# Patient Record
Sex: Female | Born: 1955 | Race: White | Hispanic: No | Marital: Married | Smoking: Never smoker
Health system: Southern US, Community
[De-identification: ages and names within clinical notes are randomized; demographics above are authoritative.]

## PROBLEM LIST (undated history)

## (undated) DIAGNOSIS — R7303 Prediabetes: Secondary | ICD-10-CM

## (undated) DIAGNOSIS — M25551 Pain in right hip: Secondary | ICD-10-CM

## (undated) DIAGNOSIS — C801 Malignant (primary) neoplasm, unspecified: Secondary | ICD-10-CM

## (undated) DIAGNOSIS — K760 Fatty (change of) liver, not elsewhere classified: Secondary | ICD-10-CM

## (undated) DIAGNOSIS — M160 Bilateral primary osteoarthritis of hip: Secondary | ICD-10-CM

## (undated) DIAGNOSIS — E785 Hyperlipidemia, unspecified: Secondary | ICD-10-CM

## (undated) DIAGNOSIS — M719 Bursopathy, unspecified: Secondary | ICD-10-CM

## (undated) DIAGNOSIS — K219 Gastro-esophageal reflux disease without esophagitis: Secondary | ICD-10-CM

## (undated) DIAGNOSIS — R2232 Localized swelling, mass and lump, left upper limb: Secondary | ICD-10-CM

## (undated) HISTORY — DX: Bursopathy, unspecified: M71.9

## (undated) HISTORY — DX: Fatty (change of) liver, not elsewhere classified: K76.0

## (undated) HISTORY — DX: Pain in right hip: M25.551

## (undated) HISTORY — PX: TUBAL LIGATION: SHX77

## (undated) HISTORY — PX: DILATION AND CURETTAGE OF UTERUS: SHX78

## (undated) HISTORY — DX: Prediabetes: R73.03

## (undated) HISTORY — PX: TONSILLECTOMY: SUR1361

## (undated) HISTORY — DX: Hyperlipidemia, unspecified: E78.5

---

## 2003-06-19 ENCOUNTER — Emergency Department (HOSPITAL_COMMUNITY): Admission: EM | Admit: 2003-06-19 | Discharge: 2003-06-20 | Payer: Self-pay | Admitting: Emergency Medicine

## 2010-02-27 ENCOUNTER — Encounter: Payer: Self-pay | Admitting: Family Medicine

## 2010-08-16 NOTE — H&P (Signed)
Courtney Wallace is an 55 y.o. female.   Chief Complaint: blood per rectum HPI: Has had intermittent blood per rectum over the past three months.  Never has had a TCS.   Some pain with bowel movements.    No past medical history on file.  No past surgical history on file.  No family history on file. Social History:  does not have a smoking history on file. She does not have any smokeless tobacco history on file. Her alcohol and drug histories not on file.  Allergies: Allergies not on file  No current facility-administered medications on file as of .   No current outpatient prescriptions on file as of .    No results found for this or any previous visit (from the past 48 hour(s)). No results found.  Review of Systems  Constitutional: Negative.   HENT: Negative.   Eyes: Negative.   Respiratory: Negative.   Cardiovascular: Negative.   Gastrointestinal: Negative.   Genitourinary: Negative.   Musculoskeletal: Negative.   Skin: Negative.   Neurological: Negative.   Endo/Heme/Allergies: Negative.   Psychiatric/Behavioral: Negative.     There were no vitals taken for this visit. Physical Exam  Constitutional: She is oriented to person, place, and time. She appears well-developed and well-nourished.  HENT:  Head: Normocephalic.  Eyes: Pupils are equal, round, and reactive to light.  Neck: Normal range of motion.  Cardiovascular: Normal rate, regular rhythm and normal heart sounds.   Respiratory: Effort normal and breath sounds normal.  GI: Soft. Bowel sounds are normal. She exhibits no distension and no mass. There is no tenderness.  Musculoskeletal: Normal range of motion.  Neurological: She is alert and oriented to person, place, and time.  Skin: Skin is warm and dry.  Psychiatric: She has a normal mood and affect. Her behavior is normal. Judgment and thought content normal.     Assessment/Plan Need for screening TCS, blood per rectum/Scheduled for TCS on  08/30/10.  Fatisha Rabalais A 08/16/2010, 5:27 PM

## 2010-08-29 MED ORDER — SODIUM CHLORIDE 0.45 % IV SOLN
Freq: Once | INTRAVENOUS | Status: AC
  Administered 2010-08-30: 08:00:00 via INTRAVENOUS

## 2010-08-30 ENCOUNTER — Ambulatory Visit (HOSPITAL_COMMUNITY)
Admission: RE | Admit: 2010-08-30 | Discharge: 2010-08-30 | Disposition: A | Discharge status: Home / Self Care | Payer: BC Managed Care – PPO | Source: Ambulatory Visit | Attending: General Surgery | Admitting: General Surgery

## 2010-08-30 ENCOUNTER — Encounter (HOSPITAL_COMMUNITY): Payer: Self-pay | Admitting: *Deleted

## 2010-08-30 ENCOUNTER — Encounter (HOSPITAL_COMMUNITY)
Admission: RE | Disposition: A | Discharge status: Home / Self Care | Payer: Self-pay | Source: Ambulatory Visit | Attending: General Surgery

## 2010-08-30 DIAGNOSIS — Z1211 Encounter for screening for malignant neoplasm of colon: Secondary | ICD-10-CM | POA: Insufficient documentation

## 2010-08-30 HISTORY — PX: COLONOSCOPY: SHX5424

## 2010-08-30 HISTORY — DX: Gastro-esophageal reflux disease without esophagitis: K21.9

## 2010-08-30 SURGERY — COLONOSCOPY
Anesthesia: Moderate Sedation

## 2010-08-30 MED ORDER — STERILE WATER FOR IRRIGATION IR SOLN
Status: DC | PRN
  Administered 2010-08-30: 09:00:00

## 2010-08-30 MED ORDER — MEPERIDINE HCL 25 MG/ML IJ SOLN
INTRAMUSCULAR | Status: DC | PRN
  Administered 2010-08-30: 50 mg via INTRAVENOUS

## 2010-08-30 MED ORDER — MEPERIDINE HCL 100 MG/ML IJ SOLN
INTRAMUSCULAR | Status: AC
  Filled 2010-08-30: qty 2

## 2010-08-30 MED ORDER — MIDAZOLAM HCL 5 MG/5ML IJ SOLN
INTRAMUSCULAR | Status: DC | PRN
  Administered 2010-08-30: 3 mg via INTRAVENOUS
  Administered 2010-08-30: 1 mg via INTRAVENOUS

## 2010-08-30 MED ORDER — MIDAZOLAM HCL 5 MG/5ML IJ SOLN
INTRAMUSCULAR | Status: AC
  Filled 2010-08-30: qty 10

## 2010-09-07 ENCOUNTER — Encounter (HOSPITAL_COMMUNITY): Payer: Self-pay | Admitting: General Surgery

## 2014-07-30 ENCOUNTER — Emergency Department (HOSPITAL_COMMUNITY)
Admission: EM | Admit: 2014-07-30 | Discharge: 2014-07-30 | Disposition: A | Discharge status: Home / Self Care | Payer: BLUE CROSS/BLUE SHIELD | Attending: Emergency Medicine | Admitting: Emergency Medicine

## 2014-07-30 ENCOUNTER — Emergency Department (HOSPITAL_COMMUNITY): Payer: BLUE CROSS/BLUE SHIELD

## 2014-07-30 ENCOUNTER — Encounter (HOSPITAL_COMMUNITY): Payer: Self-pay | Admitting: Emergency Medicine

## 2014-07-30 DIAGNOSIS — Z880 Allergy status to penicillin: Secondary | ICD-10-CM | POA: Insufficient documentation

## 2014-07-30 DIAGNOSIS — Z8719 Personal history of other diseases of the digestive system: Secondary | ICD-10-CM | POA: Insufficient documentation

## 2014-07-30 DIAGNOSIS — M51369 Other intervertebral disc degeneration, lumbar region without mention of lumbar back pain or lower extremity pain: Secondary | ICD-10-CM

## 2014-07-30 DIAGNOSIS — M47896 Other spondylosis, lumbar region: Secondary | ICD-10-CM | POA: Diagnosis not present

## 2014-07-30 DIAGNOSIS — M5136 Other intervertebral disc degeneration, lumbar region: Secondary | ICD-10-CM | POA: Diagnosis not present

## 2014-07-30 DIAGNOSIS — M545 Low back pain: Secondary | ICD-10-CM | POA: Diagnosis present

## 2014-07-30 DIAGNOSIS — M62830 Muscle spasm of back: Secondary | ICD-10-CM | POA: Diagnosis not present

## 2014-07-30 MED ORDER — IBUPROFEN 600 MG PO TABS: 600.0000 mg | ORAL_TABLET | Freq: Four times a day (QID) | ORAL | Status: DC

## 2014-07-30 MED ORDER — HYDROCODONE-ACETAMINOPHEN 5-325 MG PO TABS: 1.0000 | ORAL_TABLET | ORAL | Status: DC | PRN

## 2014-07-30 MED ORDER — DIAZEPAM 5 MG PO TABS: ORAL_TABLET | ORAL | Status: DC

## 2014-07-30 MED ORDER — ONDANSETRON HCL 4 MG PO TABS
4.0000 mg | ORAL_TABLET | Freq: Once | ORAL | Status: AC
  Administered 2014-07-30: 4 mg via ORAL
  Filled 2014-07-30: qty 1

## 2014-07-30 MED ORDER — HYDROMORPHONE HCL 1 MG/ML IJ SOLN
1.0000 mg | Freq: Once | INTRAMUSCULAR | Status: AC
  Administered 2014-07-30: 1 mg via INTRAMUSCULAR
  Filled 2014-07-30: qty 1

## 2014-07-30 MED ORDER — DIAZEPAM 5 MG PO TABS
5.0000 mg | ORAL_TABLET | Freq: Once | ORAL | Status: AC
  Administered 2014-07-30: 5 mg via ORAL
  Filled 2014-07-30: qty 1

## 2014-07-30 NOTE — ED Notes (Signed)
Pt c/o left lower back pain since 0900 this am. Pt states she has been seeing a chiropractor for back pain.

## 2014-07-30 NOTE — ED Provider Notes (Signed)
CSN: [id number]     Arrival date & time 07/30/14  1617 History   First MD Initiated Contact with Patient 07/30/14 1637     Chief Complaint  Patient presents with  . Back Pain     (Consider location/radiation/quality/duration/timing/severity/associated sxs/prior Treatment) HPI Comments: Patient is a 59 year old female who presents to the emergency department with a complaint of lower back pain, left more than right. The patient states she's had problems with her back for some years, she is usually able to do some stretching, or seizure R chiropractic physician and have the pain resolved. She was seen by the chiropractic physician on yesterday. Today she states that she was able to get up and they've herself and dress herself without problem. Approximately 9 AM when she was going to work she developed a severe pain and could not get out of the car. She has been having pain since that time, and unable to find a comfortable position to lie in, also notices that the pain is increasing. She has not had any loss of bowel or bladder function. She states that she feels that she has to shuffle to walk on Sunday that she does not upset her lower back. She has not noticed any weakness in her extremities, but has pain when she puts weight a tick leg on the left side. There's been no previous operations or procedures involving her back other than chiropractic adjustments.  Patient is a 59 y.o. female presenting with back pain. The history is provided by the patient.  Back Pain Location:  Lumbar spine Associated symptoms: no abdominal pain, no chest pain and no dysuria     Past Medical History  Diagnosis Date  . GERD (gastroesophageal reflux disease)    Past Surgical History  Procedure Laterality Date  . Dilation and curettage of uterus    . Tonsillectomy    . Colonoscopy  08/30/2010    Procedure: COLONOSCOPY;  Surgeon: Jamesetta So;  Location: [facility];  Service: Gastroenterology;  Laterality:  N/A;   No family history on file. History  Substance Use Topics  . Smoking status: Never Smoker   . Smokeless tobacco: Not on file  . Alcohol Use: No   OB History    No data available     Review of Systems  Constitutional: Negative for activity change.       All ROS Neg except as noted in HPI  HENT: Negative for nosebleeds.   Eyes: Negative for photophobia and discharge.  Respiratory: Negative for cough, shortness of breath and wheezing.   Cardiovascular: Negative for chest pain and palpitations.  Gastrointestinal: Negative for abdominal pain and blood in stool.  Genitourinary: Negative for dysuria, frequency and hematuria.  Musculoskeletal: Positive for back pain. Negative for arthralgias and neck pain.  Skin: Negative.   Neurological: Negative for dizziness, seizures and speech difficulty.  Psychiatric/Behavioral: Negative for hallucinations and confusion.      Allergies  Penicillins  Home Medications   Prior to Admission medications   Not on File   BP 124/59 mmHg  Pulse 60  Temp(Src) 97.9 F (36.6 C) (Oral)  Resp 16  Ht 5\' 2"  (1.575 m)  Wt 135 lb (61.236 kg)  BMI 24.69 kg/m2  SpO2 97% Physical Exam  Constitutional: She is oriented to person, place, and time. She appears well-developed and well-nourished.  Non-toxic appearance.  HENT:  Head: Normocephalic.  Right Ear: Tympanic membrane and external ear normal.  Left Ear: Tympanic membrane and external ear normal.  Eyes: EOM and lids are normal. Pupils are equal, round, and reactive to light.  Neck: Normal range of motion. Neck supple. Carotid bruit is not present.  Cardiovascular: Normal rate, regular rhythm, normal heart sounds, intact distal pulses and normal pulses.   Pulmonary/Chest: Breath sounds normal. No respiratory distress.  Abdominal: Soft. Bowel sounds are normal. There is no tenderness. There is no guarding.  Musculoskeletal:       Lumbar back: She exhibits decreased range of motion,  tenderness, pain and spasm. She exhibits no deformity.  Lymphadenopathy:       Head (right side): No submandibular adenopathy present.       Head (left side): No submandibular adenopathy present.    She has no cervical adenopathy.  Neurological: She is alert and oriented to person, place, and time. She has normal strength. No cranial nerve deficit or sensory deficit.  Could not adequately test gait due to patient's level of pain. No gross motor or sensory deficits appreciated of the lower extremity. No muscle atrophy appreciated.  No weakness of shoulder shrugs or grip. No sensory deficits of the upper extremities.  Skin: Skin is warm and dry.  Psychiatric: She has a normal mood and affect. Her speech is normal.  Nursing note and vitals reviewed.   ED Course  Procedures (including critical care time) Labs Review Labs Reviewed  URINALYSIS, ROUTINE W REFLEX MICROSCOPIC (NOT AT [facility])    Imaging Review No results found.   EKG Interpretation None      MDM  Vital signs are well within normal limits. Pulse oximetry is 97% on room air. Within normal limits by my interpretation. CT scan of the lumbar spine is negative for any mass or tumor. There is no compression fracture noted within the lumbar area, or the T12 area. There is noted some disc bulging with central canal narrowing at the L4-L5 area there is also some degenerative changes at the L5-S1 area with the right side being worse than the left.  After pain medication the patient some pain is was significantly improved. The gait is slow but steady. There is no evidence of foot drop, or unusual weakness.  I discussed with the patient the findings on examination, as well as the findings on the CT scan. I suggested to the patient that she discuss the changes in her usual pain and duration of pain with Dr. Nadara Mustard, as well as the findings of the CT scan for additional evaluation and management. Prescription for Valium 3 times a day, Norco  every 4 hours as needed, and ibuprofen 600 mg, 4 times daily with food is given to the patient. The patient is in agreement with this discharge plan.    Final diagnoses:  None    **I have reviewed nursing notes, vital signs, and all appropriate lab and imaging results for this patient.Lily Kocher, PA-C 07/30/14 [place], MD 07/31/14 [phone]

## 2014-07-30 NOTE — ED Notes (Signed)
Pt made aware to return if symptoms worsen or if any life threatening symptoms occur.   

## 2014-07-30 NOTE — Discharge Instructions (Signed)
Your CT scan reveals some bulging disc issues at the L4-L5 area with some mild canal narrowing. You also have some arthritis changes at the L5-S1 area, with the right side being worse than the left. Please take the CD from radiology with you to Dr. Lyman Speller office, and discuss your symptoms and these findings with him. Please use a heating pad when sitting. Please use of Valium and ibuprofen daily. Use Norco for more severe pain if needed. Both Valium and Norco may cause drowsiness, please use these medications with caution. Degenerative Disk Disease Degenerative disk disease is a condition caused by the changes that occur in the cushions of the backbone (spinal disks) as you grow older. Spinal disks are soft and compressible disks located between the bones of the spine (vertebrae). They act like shock absorbers. Degenerative disk disease can affect the whole spine. However, the neck and lower back are most commonly affected. Many changes can occur in the spinal disks with aging, such as:  The spinal disks may dry and shrink.  Small tears may occur in the tough, outer covering of the disk (annulus).  The disk space may become smaller due to loss of water.  Abnormal growths in the bone (spurs) may occur. This can put pressure on the nerve roots exiting the spinal canal, causing pain.  The spinal canal may become narrowed. CAUSES  Degenerative disk disease is a condition caused by the changes that occur in the spinal disks with aging. The exact cause is not known, but there is a genetic basis for many patients. Degenerative changes can occur due to loss of fluid in the disk. This makes the disk thinner and reduces the space between the backbones. Small cracks can develop in the outer layer of the disk. This can lead to the breakdown of the disk. You are more likely to get degenerative disk disease if you are overweight. Smoking cigarettes and doing heavy work such as weightlifting can also increase your  risk of this condition. Degenerative changes can start after a sudden injury. Growth of bone spurs can compress the nerve roots and cause pain.  SYMPTOMS  The symptoms vary from person to person. Some people may have no pain, while others have severe pain. The pain may be so severe that it can limit your activities. The location of the pain depends on the part of your backbone that is affected. You will have neck or arm pain if a disk in the neck area is affected. You will have pain in your back, buttocks, or legs if a disk in the lower back is affected. The pain becomes worse while bending, reaching up, or with twisting movements. The pain may start gradually and then get worse as time passes. It may also start after a major or minor injury. You may feel numbness or tingling in the arms or legs.  DIAGNOSIS  Your caregiver will ask you about your symptoms and about activities or habits that may cause the pain. He or she may also ask about any injuries, diseases, or treatments you have had earlier. Your caregiver will examine you to check for the range of movement that is possible in the affected area, to check for strength in your extremities, and to check for sensation in the areas of the arms and legs supplied by different nerve roots. An X-ray of the spine may be taken. Your caregiver may suggest other imaging tests, such as magnetic resonance imaging (MRI), if needed.  TREATMENT  Treatment includes  rest, modifying your activities, and applying ice and heat. Your caregiver may prescribe medicines to reduce your pain and may ask you to do some exercises to strengthen your back. In some cases, you may need surgery. You and your caregiver will decide on the treatment that is best for you. HOME CARE INSTRUCTIONS   Follow proper lifting and walking techniques as advised by your caregiver.  Maintain good posture.  Exercise regularly as advised.  Perform relaxation exercises.  Change your sitting,  standing, and sleeping habits as advised. Change positions frequently.  Lose weight as advised.  Stop smoking if you smoke.  Wear supportive footwear. SEEK MEDICAL CARE IF:  Your pain does not go away within 1 to 4 weeks. SEEK IMMEDIATE MEDICAL CARE IF:   Your pain is severe.  You notice weakness in your arms, hands, or legs.  You begin to lose control of your bladder or bowel movements. MAKE SURE YOU:   Understand these instructions.  Will watch your condition.  Will get help right away if you are not doing well or get worse. Document Released: 11/20/2006 Document Revised: 04/17/2011 Document Reviewed: 05/27/2013 [facility] Patient Information 2015 [place], [place]. This information is not intended to replace advice given to you by your health care provider. Make sure you discuss any questions you have with your health care provider.

## 2015-01-05 ENCOUNTER — Ambulatory Visit (INDEPENDENT_AMBULATORY_CARE_PROVIDER_SITE_OTHER): Payer: BLUE CROSS/BLUE SHIELD | Admitting: Podiatry

## 2015-01-05 ENCOUNTER — Ambulatory Visit (INDEPENDENT_AMBULATORY_CARE_PROVIDER_SITE_OTHER): Payer: BLUE CROSS/BLUE SHIELD

## 2015-01-05 ENCOUNTER — Encounter: Payer: Self-pay | Admitting: Podiatry

## 2015-01-05 VITALS — BP 119/69 | HR 74 | Resp 16 | Ht 62.0 in | Wt 140.0 lb

## 2015-01-05 DIAGNOSIS — D3610 Benign neoplasm of peripheral nerves and autonomic nervous system, unspecified: Secondary | ICD-10-CM | POA: Diagnosis not present

## 2015-01-05 DIAGNOSIS — M2142 Flat foot [pes planus] (acquired), left foot: Secondary | ICD-10-CM

## 2015-01-05 DIAGNOSIS — M2141 Flat foot [pes planus] (acquired), right foot: Secondary | ICD-10-CM

## 2015-01-05 NOTE — Progress Notes (Signed)
   Subjective:    Patient ID: Courtney Wallace, female    DOB: 1955/02/17, 59 y.o.   MRN: [id number]  HPI: She presents today with a 12 year duration of painful toes bilateral foot. She states that I have intermittent pain throughout my toes which sometimes is numb and tingling and other times feels like electrical shocks. She states that is very sporadic. She's tried different over-the-counter insoles but after talking to her husband about his orthotics she would like to consider orthotics.    Review of Systems  HENT: Positive for sinus pressure.   All other systems reviewed and are negative.      Objective:   Physical Exam: She is 59 year old female in no apparent distress vital signs stable alert and oriented 3. Pulses are strongly palpable. Neurologic sensorium is intact per Semmes-Weinstein monofilament. Deep tendon reflexes are intact bilateral and muscle strength +5 over 5 dorsiflexion plantar flexors and inverters everters onto the musculature is intact. Orthopedic evaluation demonstrates flexible pes planus bilateral. No reproducible pain on palpation other than a mild Mulder's click to the third interdigital space bilaterally which is quite uncomfortable for her. Radiographs taken 3 views of the office today demonstrates pes planus with no signs of arthritis. No coalitions. Cutaneous evaluation demonstrates supple well-hydrated cutis no erythema edema saline as drainage or odor. No lytic wounds lesions no odor.        Assessment & Plan:  Assessment: Pes planus with neuroma bilateral foot.  Plan: We discussed appropriate shoe gear stretching exercises ice therapy issue or modifications. We discussed the etiology pathology insert of her surgical therapies. At this point we have decided not to perform any injections to the neuromas but to try a set of orthotics. I will follow up with her once those come in. She was scanned today prior to her leaving.  Roselind Messier DPM

## 2015-01-26 ENCOUNTER — Ambulatory Visit: Payer: BLUE CROSS/BLUE SHIELD | Admitting: *Deleted

## 2015-01-26 DIAGNOSIS — M2141 Flat foot [pes planus] (acquired), right foot: Secondary | ICD-10-CM

## 2015-01-26 DIAGNOSIS — M2142 Flat foot [pes planus] (acquired), left foot: Principal | ICD-10-CM

## 2015-01-26 NOTE — Progress Notes (Signed)
Patient ID: Courtney Wallace, female   DOB: 1955-07-01, 59 y.o.   MRN: [id number] Patient presents for orthotic pick up.  Verbal and written break in and wear instructions given.  Patient will follow up in 4 weeks if symptoms worsen or fail to improve.

## 2015-01-26 NOTE — Patient Instructions (Signed)

## 2015-05-25 DIAGNOSIS — Z23 Encounter for immunization: Secondary | ICD-10-CM | POA: Diagnosis not present

## 2015-05-25 DIAGNOSIS — S81811A Laceration without foreign body, right lower leg, initial encounter: Secondary | ICD-10-CM | POA: Diagnosis not present

## 2015-05-25 DIAGNOSIS — Z6826 Body mass index (BMI) 26.0-26.9, adult: Secondary | ICD-10-CM | POA: Diagnosis not present

## 2015-05-25 DIAGNOSIS — L03115 Cellulitis of right lower limb: Secondary | ICD-10-CM | POA: Diagnosis not present

## 2015-11-22 DIAGNOSIS — Z23 Encounter for immunization: Secondary | ICD-10-CM | POA: Diagnosis not present

## 2015-11-25 DIAGNOSIS — H52223 Regular astigmatism, bilateral: Secondary | ICD-10-CM | POA: Diagnosis not present

## 2015-11-25 DIAGNOSIS — H5203 Hypermetropia, bilateral: Secondary | ICD-10-CM | POA: Diagnosis not present

## 2016-03-16 DIAGNOSIS — L738 Other specified follicular disorders: Secondary | ICD-10-CM | POA: Diagnosis not present

## 2016-03-16 DIAGNOSIS — L821 Other seborrheic keratosis: Secondary | ICD-10-CM | POA: Diagnosis not present

## 2016-03-16 DIAGNOSIS — L818 Other specified disorders of pigmentation: Secondary | ICD-10-CM | POA: Diagnosis not present

## 2016-03-16 DIAGNOSIS — L719 Rosacea, unspecified: Secondary | ICD-10-CM | POA: Diagnosis not present

## 2016-03-21 DIAGNOSIS — F4325 Adjustment disorder with mixed disturbance of emotions and conduct: Secondary | ICD-10-CM | POA: Diagnosis not present

## 2016-04-04 DIAGNOSIS — Z6827 Body mass index (BMI) 27.0-27.9, adult: Secondary | ICD-10-CM | POA: Diagnosis not present

## 2016-04-04 DIAGNOSIS — J111 Influenza due to unidentified influenza virus with other respiratory manifestations: Secondary | ICD-10-CM | POA: Diagnosis not present

## 2016-04-10 ENCOUNTER — Encounter (HOSPITAL_COMMUNITY): Payer: Self-pay | Admitting: Emergency Medicine

## 2016-04-10 ENCOUNTER — Emergency Department (HOSPITAL_COMMUNITY)
Admission: EM | Admit: 2016-04-10 | Discharge: 2016-04-10 | Disposition: A | Discharge status: Home / Self Care | Payer: BLUE CROSS/BLUE SHIELD | Attending: Emergency Medicine | Admitting: Emergency Medicine

## 2016-04-10 ENCOUNTER — Emergency Department (HOSPITAL_COMMUNITY): Payer: BLUE CROSS/BLUE SHIELD

## 2016-04-10 DIAGNOSIS — R51 Headache: Secondary | ICD-10-CM | POA: Diagnosis not present

## 2016-04-10 DIAGNOSIS — F4325 Adjustment disorder with mixed disturbance of emotions and conduct: Secondary | ICD-10-CM | POA: Diagnosis not present

## 2016-04-10 DIAGNOSIS — G44209 Tension-type headache, unspecified, not intractable: Secondary | ICD-10-CM | POA: Diagnosis not present

## 2016-04-10 DIAGNOSIS — G4489 Other headache syndrome: Secondary | ICD-10-CM | POA: Diagnosis not present

## 2016-04-10 DIAGNOSIS — R030 Elevated blood-pressure reading, without diagnosis of hypertension: Secondary | ICD-10-CM | POA: Diagnosis not present

## 2016-04-10 LAB — CBG MONITORING, ED: GLUCOSE-CAPILLARY: 114 mg/dL — AB (ref 65–99)

## 2016-04-10 MED ORDER — METOCLOPRAMIDE HCL 5 MG/ML IJ SOLN
10.0000 mg | Freq: Once | INTRAMUSCULAR | Status: AC
  Administered 2016-04-10: 10 mg via INTRAVENOUS
  Filled 2016-04-10: qty 2

## 2016-04-10 MED ORDER — METOCLOPRAMIDE HCL 10 MG PO TABS: 10.0000 mg | ORAL_TABLET | Freq: Every day | ORAL | 0 refills | Status: DC | PRN

## 2016-04-10 MED ORDER — DEXAMETHASONE SODIUM PHOSPHATE 10 MG/ML IJ SOLN
10.0000 mg | Freq: Once | INTRAMUSCULAR | Status: AC
  Administered 2016-04-10: 10 mg via INTRAVENOUS
  Filled 2016-04-10: qty 1

## 2016-04-10 MED ORDER — DIPHENHYDRAMINE HCL 50 MG/ML IJ SOLN
25.0000 mg | Freq: Once | INTRAMUSCULAR | Status: AC
  Administered 2016-04-10: 25 mg via INTRAVENOUS
  Filled 2016-04-10: qty 1

## 2016-04-10 MED ORDER — SODIUM CHLORIDE 0.9 % IV BOLUS (SEPSIS)
1000.0000 mL | Freq: Once | INTRAVENOUS | Status: AC
  Administered 2016-04-10: 1000 mL via INTRAVENOUS

## 2016-04-10 NOTE — Discharge Instructions (Signed)
One of the medications  you received is an antiinflammatory and will continue to work on getting  your headaches controlled  Drink plenty of water Keep stress down Rest  If continue to have frequent headaches, talk with your PCP about seeing a neurologist  Return for passing out, severe headache, focal weakness or numbness, lethargy, confusion

## 2016-04-10 NOTE — ED Notes (Signed)
ED Provider at bedside. 

## 2016-04-10 NOTE — ED Triage Notes (Signed)
Pt complaining of severe migraine headache. 4 over the last 48 hours. Pt had the flu last week. All migraines have lasted approx 3 hours. No N/V. No vision changes. BP 144/83, HR 72, resp 18. EMs reports no neuro deficits. Pain 10/10

## 2016-04-10 NOTE — ED Provider Notes (Signed)
Complains of 4 sudden onset headaches over the past 2 days. Headaches started at occiput and radiated around to the frontal area. There are throbbing in nature the first 3 headaches were onset after coughing episodes today's headache was onset while having a discussion in a marriage counselor's office. Other associated symptoms include dry mouth for "years" Pain is much improved since treatment here on exam she is alert and in no distress Glasgow Coma Score 15 neurologic exam cranial nerves II through XII grossly intact 8 normal Romberg normal pronator drift normal finger to nose normal. She feels improved and ready to go home presently. I don't feel that patient needs lumbar puncture in light of negative CT scan with today's headache starting less than 6 hours ago.   Orlie Dakin, MD 04/10/16 2000

## 2016-04-10 NOTE — ED Provider Notes (Signed)
[facility] DEPT Provider Note   CSN: [id number] Arrival date & time: 04/10/16  1741     History   Chief Complaint Chief Complaint  Patient presents with  . Headache    HPI VARIE Courtney Wallace is a 61 y.o. female.  HPI Pt complaining of severe migraine headache. 4 over the last 48 hours. Pt had the flu last week. All migraines have lasted approx 3 hours. No N/V. No vision changes. Pain 10/10  Patient states her symptoms are severe and sudden in onset. She states that her headache becomes a 12 out of 10Within seconds of onset of the headache. She frequently has a shooting pain across bilateral arms with it and did have based on the prior 3 episodes but did not today. She had been symptom free for the last 2 days but then today had a severe onset of acute headache without the shooting pains across her posterior head and then bilaterally on the front for head. She denies any history of migraines or headaches. She does state she's been sick recently with the flu but no fevers. She denies any neck stiffness but did have some neck stiffness 4 days ago. Denies any falls, no injury to the head. She is not on any anticoagulation. She has no history of any aneurysm since herself or family. She is otherwise healthy, denies any drug use. Denies any weakness, numbness. States her symptoms are severe. She went to her primary care prior and was given Tylenol and ibuprofen without significant improvement in her headache. She states light makes her symptoms worse.  Past Medical History:  Diagnosis Date  . GERD (gastroesophageal reflux disease)     There are no active problems to display for this patient.   Past Surgical History:  Procedure Laterality Date  . COLONOSCOPY  08/30/2010   Procedure: COLONOSCOPY;  Surgeon: Jamesetta So;  Location: [facility];  Service: Gastroenterology;  Laterality: N/A;  . DILATION AND CURETTAGE OF UTERUS    . TONSILLECTOMY      OB History    No data available        Home Medications    Prior to Admission medications   Medication Sig Start Date End Date Taking? Authorizing Provider  metoCLOPramide (REGLAN) 10 MG tablet Take 1 tablet (10 mg total) by mouth daily as needed (headache). 04/10/16   Karma Greaser, MD    Family History History reviewed. No pertinent family history.  Social History Social History  Substance Use Topics  . Smoking status: Never Smoker  . Smokeless tobacco: Never Used  . Alcohol use No     Allergies   Penicillins   Review of Systems Review of Systems  Constitutional: Negative for fever.  Allergic/Immunologic: Negative for immunocompromised state.  All other systems reviewed and are negative.    Physical Exam Updated Vital Signs BP 129/71 (BP Location: Left Arm)   Pulse 72   Temp 98 F (36.7 C) (Oral)   Resp 12   Ht 5\' 2"  (1.575 m)   Wt 67.1 kg   SpO2 100%   BMI 27.07 kg/m   Physical Exam  Constitutional: She is oriented to person, place, and time. She appears well-developed and well-nourished. She appears distressed.  Patient appears uncomfortable with her head covered by towel  HENT:  Head: Normocephalic and atraumatic.  Eyes: Conjunctivae are normal.  Neck: Neck supple.  Cardiovascular: Normal rate and regular rhythm.   No murmur heard. Pulmonary/Chest: Effort normal and breath sounds normal. No respiratory  distress.  Abdominal: Soft. There is no tenderness.  Musculoskeletal: She exhibits no edema.  Neurological: She is alert and oriented to person, place, and time. She has normal strength. She displays no atrophy, no tremor and normal reflexes. No cranial nerve deficit or sensory deficit. She exhibits normal muscle tone. She displays a negative Romberg sign. She displays no seizure activity. Coordination and gait normal.  Skin: Skin is warm and dry.  Psychiatric: She has a normal mood and affect.  Nursing note and vitals reviewed.    ED Treatments / Results  Labs (all labs  ordered are listed, but only abnormal results are displayed) Labs Reviewed  CBG MONITORING, ED - Abnormal; Notable for the following:       Result Value   Glucose-Capillary 114 (*)    All other components within normal limits    EKG  EKG Interpretation None       Radiology Ct Head Wo Contrast  Result Date: 04/10/2016 CLINICAL DATA:  Persistent headache, flu last week EXAM: CT HEAD WITHOUT CONTRAST TECHNIQUE: Contiguous axial images were obtained from the base of the skull through the vertex without intravenous contrast. COMPARISON:  None. FINDINGS: Brain: No evidence of acute infarction, hemorrhage, hydrocephalus, extra-axial collection or mass lesion/mass effect. Vascular: No hyperdense vessel or unexpected calcification. Skull: No osseous abnormality. Sinuses/Orbits: Visualized paranasal sinuses are clear. Visualized mastoid sinuses are clear. Visualized orbits demonstrate no focal abnormality. Other: None IMPRESSION: No acute intracranial pathology. Electronically Signed   By: Kathreen Devoid   On: 04/10/2016 19:22    Procedures Procedures (including critical care time)  Medications Ordered in ED Medications  metoCLOPramide (REGLAN) injection 10 mg (10 mg Intravenous Given 04/10/16 1830)  sodium chloride 0.9 % bolus 1,000 mL (0 mLs Intravenous Stopped 04/10/16 2038)  diphenhydrAMINE (BENADRYL) injection 25 mg (25 mg Intravenous Given 04/10/16 1830)  dexamethasone (DECADRON) injection 10 mg (10 mg Intravenous Given 04/10/16 1831)     Initial Impression / Assessment and Plan / ED Course  I have reviewed the triage vital signs and the nursing notes.  Pertinent labs & imaging results that were available during my care of the patient were reviewed by me and considered in my medical decision making (see chart for details).     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    Karma Greaser, MD 04/11/16 [id number]    Orlie Dakin, MD 04/13/16 1007

## 2016-04-13 ENCOUNTER — Emergency Department (HOSPITAL_COMMUNITY): Payer: BLUE CROSS/BLUE SHIELD

## 2016-04-13 ENCOUNTER — Encounter (HOSPITAL_COMMUNITY): Payer: Self-pay | Admitting: Cardiology

## 2016-04-13 ENCOUNTER — Emergency Department (HOSPITAL_COMMUNITY)
Admission: EM | Admit: 2016-04-13 | Discharge: 2016-04-13 | Disposition: A | Discharge status: Home / Self Care | Payer: BLUE CROSS/BLUE SHIELD | Attending: Emergency Medicine | Admitting: Emergency Medicine

## 2016-04-13 DIAGNOSIS — R51 Headache: Secondary | ICD-10-CM | POA: Diagnosis not present

## 2016-04-13 DIAGNOSIS — G4489 Other headache syndrome: Secondary | ICD-10-CM | POA: Diagnosis not present

## 2016-04-13 DIAGNOSIS — R519 Headache, unspecified: Secondary | ICD-10-CM

## 2016-04-13 DIAGNOSIS — R52 Pain, unspecified: Secondary | ICD-10-CM | POA: Diagnosis not present

## 2016-04-13 DIAGNOSIS — Z79899 Other long term (current) drug therapy: Secondary | ICD-10-CM | POA: Insufficient documentation

## 2016-04-13 DIAGNOSIS — R05 Cough: Secondary | ICD-10-CM | POA: Diagnosis not present

## 2016-04-13 LAB — CBC WITH DIFFERENTIAL/PLATELET
BASOS PCT: 1 %
Basophils Absolute: 0.1 10*3/uL (ref 0.0–0.1)
EOS ABS: 0.2 10*3/uL (ref 0.0–0.7)
EOS PCT: 2 %
HCT: 36 % (ref 36.0–46.0)
HEMOGLOBIN: 12.5 g/dL (ref 12.0–15.0)
LYMPHS ABS: 3.2 10*3/uL (ref 0.7–4.0)
Lymphocytes Relative: 33 %
MCH: 30.4 pg (ref 26.0–34.0)
MCHC: 34.7 g/dL (ref 30.0–36.0)
MCV: 87.6 fL (ref 78.0–100.0)
MONOS PCT: 6 %
Monocytes Absolute: 0.6 10*3/uL (ref 0.1–1.0)
NEUTROS PCT: 58 %
Neutro Abs: 5.6 10*3/uL (ref 1.7–7.7)
PLATELETS: 305 10*3/uL (ref 150–400)
RBC: 4.11 MIL/uL (ref 3.87–5.11)
RDW: 12.4 % (ref 11.5–15.5)
WBC: 9.6 10*3/uL (ref 4.0–10.5)

## 2016-04-13 LAB — COMPREHENSIVE METABOLIC PANEL
ALK PHOS: 61 U/L (ref 38–126)
ALT: 71 U/L — AB (ref 14–54)
AST: 42 U/L — ABNORMAL HIGH (ref 15–41)
Albumin: 3.6 g/dL (ref 3.5–5.0)
Anion gap: 8 (ref 5–15)
BUN: 20 mg/dL (ref 6–20)
CALCIUM: 8.6 mg/dL — AB (ref 8.9–10.3)
CHLORIDE: 108 mmol/L (ref 101–111)
CO2: 19 mmol/L — AB (ref 22–32)
CREATININE: 0.6 mg/dL (ref 0.44–1.00)
Glucose, Bld: 97 mg/dL (ref 65–99)
Potassium: 3.4 mmol/L — ABNORMAL LOW (ref 3.5–5.1)
Sodium: 135 mmol/L (ref 135–145)
Total Bilirubin: 0.3 mg/dL (ref 0.3–1.2)
Total Protein: 6.2 g/dL — ABNORMAL LOW (ref 6.5–8.1)

## 2016-04-13 LAB — CSF CELL COUNT WITH DIFFERENTIAL
RBC Count, CSF: 1268 /mm3 — ABNORMAL HIGH
RBC Count, CSF: 1350 /mm3 — ABNORMAL HIGH
TUBE #: 1
TUBE #: 4
WBC CSF: 10 /mm3 — AB (ref 0–5)
WBC, CSF: 10 /mm3 — ABNORMAL HIGH (ref 0–5)

## 2016-04-13 LAB — INFLUENZA PANEL BY PCR (TYPE A & B)
INFLAPCR: NEGATIVE
Influenza B By PCR: NEGATIVE

## 2016-04-13 LAB — GLUCOSE, CSF: Glucose, CSF: 51 mg/dL (ref 40–70)

## 2016-04-13 LAB — PROTEIN, CSF: Total  Protein, CSF: 50 mg/dL — ABNORMAL HIGH (ref 15–45)

## 2016-04-13 MED ORDER — LIDOCAINE HCL (PF) 1 % IJ SOLN
INTRAMUSCULAR | Status: DC
Start: 2016-04-13 — End: 2016-04-14
  Filled 2016-04-13: qty 5

## 2016-04-13 MED ORDER — KETOROLAC TROMETHAMINE 30 MG/ML IJ SOLN
15.0000 mg | Freq: Once | INTRAMUSCULAR | Status: AC
  Administered 2016-04-13: 15 mg via INTRAVENOUS
  Filled 2016-04-13: qty 1

## 2016-04-13 MED ORDER — SODIUM CHLORIDE 0.9 % IV BOLUS (SEPSIS)
1000.0000 mL | Freq: Once | INTRAVENOUS | Status: AC
  Administered 2016-04-13: 1000 mL via INTRAVENOUS

## 2016-04-13 MED ORDER — LIDOCAINE HCL (PF) 2 % IJ SOLN
INTRAMUSCULAR | Status: AC
  Filled 2016-04-13: qty 10

## 2016-04-13 MED ORDER — LORAZEPAM 2 MG/ML IJ SOLN
1.0000 mg | Freq: Once | INTRAMUSCULAR | Status: AC
  Administered 2016-04-13: 1 mg via INTRAVENOUS
  Filled 2016-04-13: qty 1

## 2016-04-13 MED ORDER — IOPAMIDOL (ISOVUE-370) INJECTION 76%
100.0000 mL | Freq: Once | INTRAVENOUS | Status: AC | PRN
  Administered 2016-04-13: 100 mL via INTRAVENOUS

## 2016-04-13 MED ORDER — PROCHLORPERAZINE EDISYLATE 5 MG/ML IJ SOLN
10.0000 mg | Freq: Once | INTRAMUSCULAR | Status: AC
  Administered 2016-04-13: 10 mg via INTRAVENOUS
  Filled 2016-04-13: qty 2

## 2016-04-13 MED ORDER — HYDROCODONE-ACETAMINOPHEN 5-325 MG PO TABS: 1.0000 | ORAL_TABLET | Freq: Four times a day (QID) | ORAL | 0 refills | Status: DC | PRN

## 2016-04-13 NOTE — ED Triage Notes (Signed)
Headache since 2pm.  Pt moaning and holding back of head.  Seen with same at [facility] 2 days ago.

## 2016-04-13 NOTE — Discharge Instructions (Signed)
As discussed, your evaluation today has been largely reassuring.  But, it is important that you monitor your condition carefully, and do not hesitate to return to the ED if you develop new, or concerning changes in your condition. ? ?Otherwise, please follow-up with your physician for appropriate ongoing care. ? ?

## 2016-04-13 NOTE — ED Provider Notes (Signed)
[facility] DEPT Provider Note   CSN: [id number] Arrival date & time: 04/13/16  1507     History   Chief Complaint Chief Complaint  Patient presents with  . Headache    HPI Courtney Wallace is a 61 y.o. female.  HPI  Patient presents with concern of ongoing headache. Patient has no long history of headache, but over the past week has had new headache, Headache is diffuse, wrapping around from the posterior, circumferentially, bilaterally towards the front, sore, severe, with associated neck soreness. Patient notes that she had recent URI like illness, states that she believes she had the flu. No new confusion, disorientation, though there is associated fevers, chills, anorexia, nausea. No weakness in any extremity.  Patient was seen at our affiliated facility 3 days ago states that she felt better after interventions there, but that was transient improvement.   Past Medical History:  Diagnosis Date  . GERD (gastroesophageal reflux disease)     There are no active problems to display for this patient.   Past Surgical History:  Procedure Laterality Date  . COLONOSCOPY  08/30/2010   Procedure: COLONOSCOPY;  Surgeon: Jamesetta So;  Location: [facility];  Service: Gastroenterology;  Laterality: N/A;  . DILATION AND CURETTAGE OF UTERUS    . TONSILLECTOMY      OB History    No data available       Home Medications    Prior to Admission medications   Medication Sig Start Date End Date Taking? Authorizing Provider  metoCLOPramide (REGLAN) 10 MG tablet Take 1 tablet (10 mg total) by mouth daily as needed (headache). 04/10/16   Karma Greaser, MD    Family History History reviewed. No pertinent family history.  Social History Social History  Substance Use Topics  . Smoking status: Never Smoker  . Smokeless tobacco: Never Used  . Alcohol use No     Allergies   Penicillins   Review of Systems Review of Systems  Constitutional:       Per HPI,  otherwise negative  HENT:       Per HPI, otherwise negative  Eyes: Positive for photophobia.  Respiratory:       Per HPI, otherwise negative  Cardiovascular:       Per HPI, otherwise negative  Gastrointestinal: Positive for nausea. Negative for vomiting.  Endocrine:       Negative aside from HPI  Genitourinary:       Neg aside from HPI   Musculoskeletal:       Per HPI, otherwise negative  Skin: Negative.   Neurological: Positive for light-headedness and headaches. Negative for syncope.     Physical Exam Updated Vital Signs BP 153/82 (BP Location: Left Arm)   Pulse 73   Temp 97.5 F (36.4 C) (Oral)   Resp 24   Ht 5\' 2"  (1.575 m)   Wt 148 lb (67.1 kg)   SpO2 100%   BMI 27.07 kg/m   Physical Exam  Constitutional: She is oriented to person, place, and time. She appears well-developed and well-nourished.  Uncomfortable appearing elderly female  HENT:  Head: Normocephalic and atraumatic.  Eyes: Conjunctivae and EOM are normal.  Neck:  Patient hesitant to move her neck secondary to pain  Cardiovascular: Normal rate and regular rhythm.   Pulmonary/Chest: Effort normal and breath sounds normal. No stridor. No respiratory distress.  Abdominal: She exhibits no distension.  Musculoskeletal: She exhibits no edema.  Neurological: She is alert and oriented to person, place, and  time. No cranial nerve deficit. She exhibits normal muscle tone. Coordination normal.  Skin: Skin is warm and dry.  Psychiatric: She has a normal mood and affect.  Nursing note and vitals reviewed.    ED Treatments / Results  Labs (all labs ordered are listed, but only abnormal results are displayed) Labs Reviewed  COMPREHENSIVE METABOLIC PANEL - Abnormal; Notable for the following:       Result Value   Potassium 3.4 (*)    CO2 19 (*)    Calcium 8.6 (*)    Total Protein 6.2 (*)    AST 42 (*)    ALT 71 (*)    All other components within normal limits  CSF CELL COUNT WITH DIFFERENTIAL -  Abnormal; Notable for the following:    RBC Count, CSF 1,350 (*)    WBC, CSF 10 (*)    All other components within normal limits  CSF CELL COUNT WITH DIFFERENTIAL - Abnormal; Notable for the following:    RBC Count, CSF 1,268 (*)    WBC, CSF 10 (*)    All other components within normal limits  PROTEIN, CSF - Abnormal; Notable for the following:    Total  Protein, CSF 50 (*)    All other components within normal limits  CSF CULTURE  GRAM STAIN  CBC WITH DIFFERENTIAL/PLATELET  GLUCOSE, CSF  INFLUENZA PANEL BY PCR (TYPE A & B)     Radiology Ct Angio Head W Or Wo Contrast  Result Date: 04/13/2016 CLINICAL DATA:  Initial evaluation for acute headache, blood in LP. EXAM: CT ANGIOGRAPHY HEAD TECHNIQUE: Multidetector CT imaging of the head was performed using the standard protocol during bolus administration of intravenous contrast. Multiplanar CT image reconstructions and MIPs were obtained to evaluate the vascular anatomy. CONTRAST:  100 cc of Isovue 370. COMPARISON:  Prior CT from 04/10/2016. FINDINGS: CT HEAD Brain: Cerebral volume within normal limits. No acute subarachnoid or other intracranial hemorrhage identified. No evidence for acute large vessel territory infarct. No mass lesion, midline shift or mass effect. Ventricles normal size without evidence for hydrocephalus. The no extra-axial fluid collection. Vascular: No asymmetric hyperdense vessel. Skull: Scalp soft tissues within normal limits.  Calvarium intact. Sinuses: The visualized paranasal sinuses are clear. No mastoid effusion. Orbits: Visualized globes and orbital soft tissues within normal limits. CTA HEAD Anterior circulation: The petrous, cavernous, and supraclinoid segments of the internal carotid arteries are widely patent without flow limiting stenosis. A1 segments widely patent bilaterally. Anterior communicating artery normal. Anterior cerebral arteries widely patent to their distal aspects. M1 segments widely patent without  stenosis or occlusion. MCA bifurcations normal. Distal MCA branches well opacified and symmetric. Mild small vessel atherosclerotic changes noted, most evident within than left MCA branches. Posterior circulation: Visualized distal vertebral arteries widely patent to the vertebrobasilar junction. Posterior inferior cerebral arteries patent bilaterally. Basilar artery widely patent. Both of the superior cerebral arteries patent bilaterally. Right SCA arises from the right P1 segment. Both of the posterior cerebral artery supplied via the basilar and are widely patent to their distal aspects. Venous sinuses: Patent. Left transverse sinus hypoplastic and not well visualized. Anatomic variants: No significant anatomic variant. No aneurysm or vascular malformation. Delayed phase: No pathologic enhancement. IMPRESSION: 1. Negative CTA of the head. No aneurysm or vascular abnormality identified. No findings to explain patient's symptoms identified. 2. Relatively mild atherosclerotic changes for patient age. No high-grade or correctable stenosis identified. Electronically Signed   By: Jeannine Boga M.D.   On: 04/13/2016 22:25  Dg Chest Port 1 View  Result Date: 04/13/2016 CLINICAL DATA:  Headache and cough.  Fever. EXAM: PORTABLE CHEST 1 VIEW COMPARISON:  None. FINDINGS: Cardiomediastinal silhouette is normal. No pleural effusions or focal consolidations. Trachea projects midline and there is no pneumothorax. Soft tissue planes and included osseous structures are non-suspicious. IMPRESSION: Normal chest. Electronically Signed   By: Elon Alas M.D.   On: 04/13/2016 16:51    Procedures .Lumbar Puncture Date/Time: 04/13/2016 8:37 PM Performed by: Carmin Muskrat Authorized by: Carmin Muskrat   Consent:    Consent obtained:  Verbal   Consent given by:  Patient   Risks discussed:  Bleeding, headache, infection, pain, repeat procedure and nerve damage   Alternatives discussed:  No treatment,  alternative treatment and observation Pre-procedure details:    Procedure purpose:  Diagnostic   Preparation: Patient was prepped and draped in usual sterile fashion   Sedation:    Sedation type:  Anxiolysis Anesthesia (see MAR for exact dosages):    Anesthesia method:  Local infiltration   Local anesthetic:  Lidocaine 1% w/o epi Procedure details:    Lumbar space:  L4-L5 interspace   Patient position:  Sitting   Needle gauge:  18   Needle type:  Diamond point   Needle length (in):  3.5   Ultrasound guidance: yes (for landmarks prior to attempt)     Number of attempts:  3   Opening pressure (cm H2O):  24   Closing pressure (cm H2O):  24   Fluid appearance:  Blood-tinged   Tubes of fluid:  4   Total volume (ml):  8 Post-procedure:    Puncture site:  Adhesive bandage applied   Patient tolerance of procedure:  Tolerated well, no immediate complications   (including critical care time)  Medications Ordered in ED Medications  lidocaine (XYLOCAINE) 2 % injection (not administered)  lidocaine (PF) (XYLOCAINE) 1 % injection (not administered)  sodium chloride 0.9 % bolus 1,000 mL (0 mLs Intravenous Stopped 04/13/16 2031)  prochlorperazine (COMPAZINE) injection 10 mg (10 mg Intravenous Given 04/13/16 1631)  sodium chloride 0.9 % bolus 1,000 mL (0 mLs Intravenous Stopped 04/13/16 2031)  ketorolac (TORADOL) 30 MG/ML injection 15 mg (15 mg Intravenous Given 04/13/16 1843)  LORazepam (ATIVAN) injection 1 mg (1 mg Intravenous Given 04/13/16 1842)  sodium chloride 0.9 % bolus 1,000 mL (0 mLs Intravenous Stopped 04/13/16 2031)  iopamidol (ISOVUE-370) 76 % injection 100 mL (100 mLs Intravenous Contrast Given 04/13/16 2134)     Initial Impression / Assessment and Plan / ED Course  I have reviewed the triage vital signs and the nursing notes.  Pertinent labs & imaging results that were available during my care of the patient were reviewed by me and considered in my medical decision making (see chart for  details).  Review notable for evaluation 3 days ago, but CT unremarkable   10:36 PM I reviewed the CT angiography results myself, and then discuss them with the family.  I had previously discussed the LP results with our neurology colleagues, in anticipation of CT angiography. With reassuring CT angiography results, no evidence for meningitis, and with the patient's improvement, the patient will be discharged to follow-up with her physician tomorrow, and with neurology within a week. Patient will be provided a short course of medication for symptomatically relief Patient was offered option of staying for further monitoring, symptom control, placed a preference for discharge with close outpatient follow-up. Given the aforementioned reassuring findings, this seems reasonable.   Final Clinical Impressions(s) /  ED Diagnoses   Final diagnoses:  Bad headache    New Prescriptions New Prescriptions   HYDROCODONE-ACETAMINOPHEN (NORCO/VICODIN) 5-325 MG TABLET    Take 1 tablet by mouth every 6 (six) hours as needed for severe pain.     Carmin Muskrat, MD 04/13/16 2238

## 2016-04-14 DIAGNOSIS — Z6827 Body mass index (BMI) 27.0-27.9, adult: Secondary | ICD-10-CM | POA: Diagnosis not present

## 2016-04-14 DIAGNOSIS — R51 Headache: Secondary | ICD-10-CM | POA: Diagnosis not present

## 2016-04-14 LAB — GRAM STAIN: GRAM STAIN: NONE SEEN

## 2016-04-17 LAB — CSF CULTURE W GRAM STAIN: Culture: NO GROWTH

## 2016-04-17 LAB — PATHOLOGIST SMEAR REVIEW

## 2016-04-17 MED FILL — Hydrocodone-Acetaminophen Tab 5-325 MG: ORAL | Qty: 6 | Status: AC

## 2016-04-18 ENCOUNTER — Encounter (HOSPITAL_COMMUNITY): Payer: Self-pay | Admitting: Emergency Medicine

## 2016-04-18 ENCOUNTER — Emergency Department (HOSPITAL_COMMUNITY)
Admission: EM | Admit: 2016-04-18 | Discharge: 2016-04-18 | Disposition: A | Discharge status: Home / Self Care | Payer: BLUE CROSS/BLUE SHIELD | Attending: Emergency Medicine | Admitting: Emergency Medicine

## 2016-04-18 DIAGNOSIS — G43801 Other migraine, not intractable, with status migrainosus: Secondary | ICD-10-CM | POA: Diagnosis not present

## 2016-04-18 DIAGNOSIS — Z6826 Body mass index (BMI) 26.0-26.9, adult: Secondary | ICD-10-CM | POA: Diagnosis not present

## 2016-04-18 DIAGNOSIS — G43909 Migraine, unspecified, not intractable, without status migrainosus: Secondary | ICD-10-CM | POA: Diagnosis present

## 2016-04-18 DIAGNOSIS — G43811 Other migraine, intractable, with status migrainosus: Secondary | ICD-10-CM | POA: Diagnosis not present

## 2016-04-18 DIAGNOSIS — R51 Headache: Secondary | ICD-10-CM | POA: Diagnosis not present

## 2016-04-18 NOTE — ED Provider Notes (Signed)
[facility] DEPT Provider Note   CSN: [id number] Arrival date & time: 04/18/16  1515     History   Chief Complaint Chief Complaint  Patient presents with  . Migraine    HPI Courtney Wallace is a 61 y.o. female.  HPI 61 year old female who presents to the ED after being referred by her PCP to the ED for neurology referral. Patient has been suffering from new onset migraine-like headache that began 2 weeks ago. She's been evaluated in the emergency department 3 additional times with extensive workup performed that did not reveal etiology of the patient's pain. She was referred back to her primary care doctor for additional workup and management. Currently patient is describing the similar headache currently 2 out of 10, that is exacerbated by light and coughing. Denies any new visual disturbance, focal deficits. Denies any other physical complaints at this time.   Past Medical History:  Diagnosis Date  . GERD (gastroesophageal reflux disease)     There are no active problems to display for this patient.   Past Surgical History:  Procedure Laterality Date  . COLONOSCOPY  08/30/2010   Procedure: COLONOSCOPY;  Surgeon: Jamesetta So;  Location: [facility];  Service: Gastroenterology;  Laterality: N/A;  . DILATION AND CURETTAGE OF UTERUS    . TONSILLECTOMY      OB History    No data available       Home Medications    Prior to Admission medications   Medication Sig Start Date End Date Taking? Authorizing Provider  HYDROcodone-acetaminophen (NORCO/VICODIN) 5-325 MG tablet Take 1 tablet by mouth every 6 (six) hours as needed for severe pain. 04/13/16   Carmin Muskrat, MD  metoCLOPramide (REGLAN) 10 MG tablet Take 1 tablet (10 mg total) by mouth daily as needed (headache). 04/10/16   Karma Greaser, MD    Family History No family history on file.  Social History Social History  Substance Use Topics  . Smoking status: Never Smoker  . Smokeless tobacco: Never Used    . Alcohol use No     Allergies   Penicillins   Review of Systems Review of Systems  Ten systems are reviewed and are negative for acute change except as noted in the HPI  Physical Exam Updated Vital Signs BP 119/75   Pulse 71   Temp 97.9 F (36.6 C)   Resp 14   SpO2 100%   Physical Exam  Constitutional: She is oriented to person, place, and time. She appears well-developed and well-nourished. No distress.  HENT:  Head: Normocephalic and atraumatic.  Nose: Nose normal.  Eyes: Conjunctivae and EOM are normal. Pupils are equal, round, and reactive to light. Right eye exhibits no discharge. Left eye exhibits no discharge. No scleral icterus.  Neck: Normal range of motion. Neck supple.  Cardiovascular: Normal rate and regular rhythm.  Exam reveals no gallop and no friction rub.   No murmur heard. Pulmonary/Chest: Effort normal and breath sounds normal. No stridor. No respiratory distress. She has no rales.  Abdominal: Soft. She exhibits no distension. There is no tenderness.  Musculoskeletal: She exhibits no edema or tenderness.  Neurological: She is alert and oriented to person, place, and time.  Mental Status: Alert and oriented to person, place, and time. Attention and concentration normal. Speech clear. Recent memory is intac  Cranial Nerves  II Visual Fields: Intact to confrontation. Visual fields intact. III, IV, VI: Pupils equal and reactive to light and near. Full eye movement without nystagmus  V Facial Sensation: Normal. No weakness of masticatory muscles  VII: No facial weakness or asymmetry  VIII Auditory Acuity: Grossly normal  IX/X: The uvula is midline; the palate elevates symmetrically  XI: Normal sternocleidomastoid and trapezius strength  XII: The tongue is midline. No atrophy or fasciculations.   Motor System: Muscle Strength: 5/5 and symmetric in the upper and lower extremities. No pronation or drift.  Muscle Tone: Tone and muscle bulk are normal in  the upper and lower extremities.   Reflexes: DTRs: 2+ and symmetrical in all four extremities. Plantar responses are flexor bilaterally.  Coordination: Intact finger-to-nose, heel-to-shin, and rapid alternating movements. No tremor.  Sensation: Intact to light touch, and pinprick. Negative Romberg test.  Gait: Routine gait normal    Skin: Skin is warm and dry. No rash noted. She is not diaphoretic. No erythema.  Psychiatric: She has a normal mood and affect.  Vitals reviewed.    ED Treatments / Results  Labs (all labs ordered are listed, but only abnormal results are displayed) Labs Reviewed - No data to display  EKG  EKG Interpretation None       Radiology No results found.  Procedures Procedures (including critical care time)  Medications Ordered in ED Medications - No data to display   Initial Impression / Assessment and Plan / ED Course  I have reviewed the triage vital signs and the nursing notes.  Pertinent labs & imaging results that were available during my care of the patient were reviewed by me and considered in my medical decision making (see chart for details).     Patient's pain is currently bearable and not requiring any medication at this time. The extensive previous workup do not feel that additional advanced imaging or blood tests are required at this time. She is safe for discharge with strict return precautions.  I provided patient with information to [facility] neurology for follow up.   Final Clinical Impressions(s) / ED Diagnoses   Final diagnoses:  Other migraine with status migrainosus, intractable   Disposition: Discharge  Condition: Good  I have discussed the results, Dx and Tx plan with the patient who expressed understanding and agree(s) with the plan. Discharge instructions discussed at great length. The patient was given strict return precautions who verbalized understanding of the instructions. No further questions at  time of discharge.    New Prescriptions   No medications on file    Follow Up: Rory Percy, MD [place] [address] [phone]  Schedule an appointment as soon as possible for a visit    [place] [address] [place], [place] [phone] Call    [facility] [address]     [place] [address] [phone] Call        Fatima Blank, MD 04/18/16 2023

## 2016-04-18 NOTE — ED Triage Notes (Signed)
Pt reports migraine headache x8 days, states she has been seen here and ap, has had CT and lumbar punctures with no definitive diagnosis. Pt saw pcp today who wanted patient to come to the ED to be evaluated for possible admission in order to see a neurologist sooner. Speech clear, pt a/ox4, ambulated into triage with steadiness. No neuro deficits noted.

## 2016-04-20 ENCOUNTER — Encounter: Payer: Self-pay | Admitting: Neurology

## 2016-04-20 ENCOUNTER — Ambulatory Visit (INDEPENDENT_AMBULATORY_CARE_PROVIDER_SITE_OTHER): Payer: BLUE CROSS/BLUE SHIELD | Admitting: Neurology

## 2016-04-20 VITALS — BP 118/64 | HR 78 | Resp 16 | Ht 62.0 in | Wt 147.0 lb

## 2016-04-20 DIAGNOSIS — R519 Headache, unspecified: Secondary | ICD-10-CM

## 2016-04-20 DIAGNOSIS — H918X2 Other specified hearing loss, left ear: Secondary | ICD-10-CM | POA: Diagnosis not present

## 2016-04-20 DIAGNOSIS — M542 Cervicalgia: Secondary | ICD-10-CM | POA: Diagnosis not present

## 2016-04-20 DIAGNOSIS — R51 Headache: Secondary | ICD-10-CM

## 2016-04-20 MED ORDER — AMITRIPTYLINE HCL 25 MG PO TABS: ORAL_TABLET | ORAL | 5 refills | Status: DC

## 2016-04-20 MED ORDER — ONDANSETRON HCL 4 MG PO TABS
4.0000 mg | ORAL_TABLET | Freq: Three times a day (TID) | ORAL | 0 refills | Status: DC | PRN
Start: 2016-04-20 — End: 2019-09-25

## 2016-04-20 NOTE — Patient Instructions (Addendum)
We will do a brain scan, called MRI and call you with the test results. We will have to schedule you for this on a separate date. This test requires authorization from your insurance, and we will take care of the insurance process.  We will do a cervical spine (i.e. neck) MRI to look for degenerative changes and for this new neck pain.   We will check blood work today and call you with the test results.  We will start you on Elavil (generic name: amitriptyline) 25 mg: Take half a pill daily at bedtime for one week, then one pill daily at bedtime for one week, then one and a half pills daily at bedtime for one week, then 2 pills daily at bedtime thereafter. Common side effects reported are: mouth dryness, drowsiness, confusion, dizziness.   You may need to see an ENT specialist.

## 2016-04-20 NOTE — Progress Notes (Signed)
Subjective:    Patient ID: Courtney Wallace is a 61 y.o. female.  HPI     Star Age, MD, PhD [facility] [address] [place], [place] [address]  I saw patient Courtney Wallace as a referral from the ER. The patient is accompanied by her husband and sister in law today. She is a 89 year old right-handed woman with an underlying medical history of reflux disease, who presented to the emergency room 3 times in the month of March 2018, with complaints of headache. I reviewed the emergency room records from 04/10/2016, 04/13/2016 as well as 04/18/2016. She was treated symptomatically for headaches. Workup in the emergency room included CT head without contrast on 04/10/2016 which showed no acute intracranial pathology. She had a CT angiogram of the head on 04/13/2016 which I reviewed: IMPRESSION: 1. Negative CTA of the head. No aneurysm or vascular abnormality identified. No findings to explain patient's symptoms identified. 2. Relatively mild atherosclerotic changes for patient age. No high-grade or correctable stenosis identified.   On 04/13/2016 she was treated in the emergency room with Compazine, Toradol, Ativan and IV fluids. She was given a prescription for hydrocodone. On 04/10/2016 she was treated symptomatically with Reglan, Benadryl, Decadron. She had a lumbar puncture on 04/13/2016 with benign findings. Total protein was 50. She had about 3 HA days, back to back that led to the ER visit on 04/10/16.  She had severe bioccipital HA, radiating forward to the bifrontal areas, sharp pain. This week, she realized, that she does not hear as well on the L. She has had photophobia, and N/V, no recently. She had severe neck pain which radiated upwards. She has no prior history of headaches, headaches started abruptly, this was an early in the evenings, she does not have morning headaches. She sleeps reasonably well, intermittent snoring. She has never had  hearing loss but has noted recent hearing loss about 2 days ago on the left, no ringing in the ears. She denies any one-sided weakness, numbness, facial droop or drooling, or slurring of speech. A month ago she had a flu and was treated for it.   Her Past Medical History Is Significant For: Past Medical History:  Diagnosis Date  . GERD (gastroesophageal reflux disease)     Her Past Surgical History Is Significant For: Past Surgical History:  Procedure Laterality Date  . COLONOSCOPY  08/30/2010   Procedure: COLONOSCOPY;  Surgeon: Jamesetta So;  Location[facility];  Service: Gastroenterology;  Laterality: N/A;  . DILATION AND CURETTAGE OF UTERUS    . TONSILLECTOMY      Her Family History Is Significant For: No family history on file.  Her Social History Is Significant For: Social History   Social History  . Marital status: Married    Spouse name: N/A  . Number of children: N/A  . Years of education: N/A   Social History Main Topics  . Smoking status: Never Smoker  . Smokeless tobacco: Never Used  . Alcohol use No  . Drug use: No  . Sexual activity: Not Asked   Other Topics Concern  . None   Social History Narrative   Usually drinks 1 cup of coffee a day     Her Allergies Are:  Allergies  Allergen Reactions  . Penicillins Rash    Has patient had a PCN reaction causing immediate rash, facial/tongue/throat swelling, SOB or lightheadedness with hypotension: Yes Has patient had a PCN reaction causing severe rash involving mucus  membranes or skin necrosis: No Has patient had a PCN reaction that required hospitalization No Has patient had a PCN reaction occurring within the last 10 years: No If all of the above answers are "NO", then may proceed with Cephalosporin use.   :   Her Current Medications Are:  Outpatient Encounter Prescriptions as of 04/20/2016  Medication Sig  . naproxen sodium (ANAPROX) 220 MG tablet Take 220 mg by mouth 2 (two) times daily with a  meal.  . [DISCONTINUED] HYDROcodone-acetaminophen (NORCO/VICODIN) 5-325 MG tablet Take 1 tablet by mouth every 6 (six) hours as needed for severe pain.  . [DISCONTINUED] metoCLOPramide (REGLAN) 10 MG tablet Take 1 tablet (10 mg total) by mouth daily as needed (headache).   No facility-administered encounter medications on file as of 04/20/2016.   :   Review of Systems:  Out of a complete 14 point review of systems, all are reviewed and negative with the exception of these symptoms as listed below: Review of Systems  Neurological:       Patient states that she had her 1st migraine in the beginning of March. Never had migraines before. 2 of the migraines have felt like "being tazed". Light, noises, and smells will trigger these migraines.  CT scans completed.      Objective:  Neurologic Exam  Physical Exam Physical Examination:   Vitals:   04/20/16 1200  BP: 118/64  Pulse: 78  Resp: 16    General Examination: The patient is a very pleasant 61 y.o. female in no acute distress. She appears well-developed and well-nourished and well groomed.   HEENT: Normocephalic, atraumatic, pupils are equal, round and reactive to light and accommodation. Funduscopic exam is normal with sharp disc margins noted. She is mildly sensitive to light and is wondering dark shades on top of her corrective eyeglasses which she is able to take off. She has no tenderness on palpation of the base of her skull, posterior neck muscles or temples. No palpable cord. Extraocular tracking is good without limitation to gaze excursion or nystagmus noted. Normal smooth pursuit is noted. Hearing is grossly intact. Tympanic membranes are clear bilaterally. Face is symmetric with normal facial animation and normal facial sensation. Speech is clear with no dysarthria noted. There is no hypophonia. There is no lip, neck/head, jaw or voice tremor. Neck is supple with full range of passive and active motion. There are no carotid  bruits on auscultation. Oropharynx exam reveals: mild mouth dryness, adequate dental hygiene. Tongue protrudes centrally and palate elevates symmetrically.   Chest: Clear to auscultation without wheezing, rhonchi or crackles noted.  Heart: S1+S2+0, regular and normal without murmurs, rubs or gallops noted.   Abdomen: Soft, non-tender and non-distended with normal bowel sounds appreciated on auscultation.  Extremities: There is no pitting edema in the distal lower extremities bilaterally. Pedal pulses are intact.  Skin: Warm and dry without trophic changes noted.  Musculoskeletal: exam reveals no obvious joint deformities, tenderness or joint swelling or erythema.   Neurologically:  Mental status: The patient is awake, alert and oriented in all 4 spheres. Her immediate and remote memory, attention, language skills and fund of knowledge are appropriate. There is no evidence of aphasia, agnosia, apraxia or anomia. Speech is clear with normal prosody and enunciation. Thought process is linear. Mood is normal and affect is normal.  Cranial nerves II - XII are as described above under HEENT exam. In addition: shoulder shrug is normal with equal shoulder height noted. Motor exam: Normal bulk, strength and  tone is noted. There is no drift, tremor or rebound. Romberg is negative. Reflexes are 2 to 3+ throughout. Babinski: Toes are flexor bilaterally. Fine motor skills and coordination: intact with normal finger taps, normal hand movements, normal rapid alternating patting, normal foot taps and normal foot agility.  Cerebellar testing: No dysmetria or intention tremor on finger to nose testing. Heel to shin is unremarkable bilaterally. There is no truncal or gait ataxia.  Sensory exam: intact to light touch, pinprick, vibration, temperature sense in the upper and lower extremities.  Gait, station and balance: She stands easily. No veering to one side is noted. No leaning to one side is noted. Posture is  age-appropriate and stance is narrow based. Gait shows normal stride length and normal pace. No problems turning are noted. Tandem walk is unremarkable.    Assessment and Plan:    In summary, Courtney Wallace is a very pleasant 61 y.o.-year old female with an underlying medical history of reflux disease, who presents for evaluation for new onset headaches. These were fairly abrupt in onset and have been ongoing, recurring mostly with hysteria neck pain and base of the head pain, bilateral pain, fairly severe, associated with photophobia and some nausea, rare vomiting. These headaches are not one-sided and it is unusual to present with new onset migraines at this age. We will do further workup with a formal [place] MRI with and without contrast and neck MRI. We need to rule out neck and brain structural lesion. She has already had a head CT without contrast and CT angioma which were nonrevealing, but an MRI neck and head would give additional information. Thankfully, neurological exam is nonfocal. She is advised to consider seeing an ENT, she is advised to discuss this with PCP as well. Furthermore, for symptomatically treatment I suggested amitriptyline starting at 25 mg strength half a pill each night with gradual titration. We talked about potential side effects, including exacerbation or new onset of depressive symptoms. She was given a prescription as well as written instructions for titration. Also provided a prescription for generic Zofran for as needed use for nausea. I also suggested we do some blood work, including ANA, ESR, CRP, and we will call her with her test results. I will see her back routinely after testing is completed. I answered all their questions today and the patient and her family were in agreement.

## 2016-04-21 LAB — TSH: TSH: 1.02 u[IU]/mL (ref 0.450–4.500)

## 2016-04-21 LAB — C-REACTIVE PROTEIN: CRP: 3.9 mg/L (ref 0.0–4.9)

## 2016-04-21 LAB — SEDIMENTATION RATE: Sed Rate: 4 mm/hr (ref 0–40)

## 2016-04-21 LAB — ANA W/REFLEX: ANA: NEGATIVE

## 2016-04-24 NOTE — Progress Notes (Signed)
Please call and advise the patient that the recent labs we checked were within normal limits. We checked thyroid function screening and inflammatory markers, incl sedimentation rate, and CRP, all normal. No further action is required on these tests at this time. Please remind patient to keep any upcoming appointments or tests and to call us with any interim questions, concerns, problems or updates. Thanks,  Star Age, MD, PhD

## 2016-04-25 ENCOUNTER — Telehealth: Payer: Self-pay

## 2016-04-25 NOTE — Telephone Encounter (Signed)
-----   Message from Star Age, MD sent at 04/24/2016  7:56 AM EDT ----- Please call and advise the patient that the recent labs we checked were within normal limits. We checked thyroid function screening and inflammatory markers, incl sedimentation rate, and CRP, all normal. No further action is required on these tests at this time. Please remind patient to keep any upcoming appointments or tests and to call us with any interim questions, concerns, problems or updates. Thanks,  Star Age, MD, PhD

## 2016-04-25 NOTE — Telephone Encounter (Signed)
I spoke to patient and she is aware of results and recommendations.  

## 2016-04-30 ENCOUNTER — Ambulatory Visit
Admission: RE | Admit: 2016-04-30 | Discharge: 2016-04-30 | Disposition: A | Discharge status: Home / Self Care | Payer: BLUE CROSS/BLUE SHIELD | Source: Ambulatory Visit | Attending: Neurology | Admitting: Neurology

## 2016-04-30 DIAGNOSIS — R51 Headache: Principal | ICD-10-CM

## 2016-04-30 DIAGNOSIS — R519 Headache, unspecified: Secondary | ICD-10-CM

## 2016-04-30 DIAGNOSIS — M542 Cervicalgia: Secondary | ICD-10-CM | POA: Diagnosis not present

## 2016-04-30 DIAGNOSIS — H918X2 Other specified hearing loss, left ear: Secondary | ICD-10-CM | POA: Diagnosis not present

## 2016-04-30 DIAGNOSIS — M50222 Other cervical disc displacement at C5-C6 level: Secondary | ICD-10-CM | POA: Diagnosis not present

## 2016-04-30 DIAGNOSIS — H9190 Unspecified hearing loss, unspecified ear: Secondary | ICD-10-CM | POA: Diagnosis not present

## 2016-04-30 DIAGNOSIS — M50223 Other cervical disc displacement at C6-C7 level: Secondary | ICD-10-CM | POA: Diagnosis not present

## 2016-04-30 MED ORDER — GADOBENATE DIMEGLUMINE 529 MG/ML IV SOLN
13.0000 mL | Freq: Once | INTRAVENOUS | Status: AC | PRN
  Administered 2016-04-30: 13 mL via INTRAVENOUS

## 2016-05-01 NOTE — Progress Notes (Signed)
Please call patient or husband: MRI brain with and without contrast showed Wallace-appropriate findings, MRI neck showed mild degenerative changes, no cord compression, no pinched nerve type changes, certainly no surgical type changes that would warrant referral to a spine specialist. Overall benign findings with both the scans, in keeping with Wallace-appropriate changes. No further action is required on either test.  Courtney Age, MD, PhD [facility] ([place])

## 2016-05-03 ENCOUNTER — Telehealth: Payer: Self-pay

## 2016-05-03 NOTE — Telephone Encounter (Signed)
-----   Message from Star Age, MD sent at 05/01/2016  4:38 PM EDT ----- Please call patient or husband: MRI brain with and without contrast showed age-appropriate findings, MRI neck showed mild degenerative changes, no cord compression, no pinched nerve type changes, certainly no surgical type changes that would warrant referral to a spine specialist. Overall benign findings with both the scans, in keeping with age-appropriate changes. No further action is required on either test.  Star Age, MD, PhD [facility] ([place])

## 2016-05-03 NOTE — Telephone Encounter (Signed)
I spoke to the patient and gave results below.  Patient asks where to go from here? She asks if you have any idea what caused her pains and could they happen again?

## 2016-05-03 NOTE — Telephone Encounter (Signed)
Proceed as planned with the amitriptyline titration and as mentioned, she may need to see ENT next. Please relay to patient.

## 2016-05-04 NOTE — Telephone Encounter (Signed)
I left message with information below. Left call back number for any further questions.

## 2016-06-19 ENCOUNTER — Ambulatory Visit (INDEPENDENT_AMBULATORY_CARE_PROVIDER_SITE_OTHER): Payer: BLUE CROSS/BLUE SHIELD | Admitting: Otolaryngology

## 2016-06-19 DIAGNOSIS — R51 Headache: Secondary | ICD-10-CM | POA: Diagnosis not present

## 2016-06-19 DIAGNOSIS — H903 Sensorineural hearing loss, bilateral: Secondary | ICD-10-CM | POA: Diagnosis not present

## 2017-02-07 DIAGNOSIS — H43813 Vitreous degeneration, bilateral: Secondary | ICD-10-CM | POA: Diagnosis not present

## 2017-02-07 DIAGNOSIS — H59812 Chorioretinal scars after surgery for detachment, left eye: Secondary | ICD-10-CM | POA: Diagnosis not present

## 2017-02-07 DIAGNOSIS — H43393 Other vitreous opacities, bilateral: Secondary | ICD-10-CM | POA: Diagnosis not present

## 2017-02-07 DIAGNOSIS — H33312 Horseshoe tear of retina without detachment, left eye: Secondary | ICD-10-CM | POA: Diagnosis not present

## 2017-10-15 IMAGING — CT CT HEAD W/O CM
4 series · 17 of 47 positions shown, 19 images · non-contrast
Comparison: None.

CLINICAL DATA: Persistent headache, flu last week

EXAM:
CT HEAD WITHOUT CONTRAST
TECHNIQUE: Contiguous axial images were obtained from the base of the skull
through the vertex without intravenous contrast.

[Series 2: head without · axial · non-contrast · 0.40mm/px · z∈[-121,-6]mm · 7 of 31 slices shown, 9 images]
[im 4/31  brain]
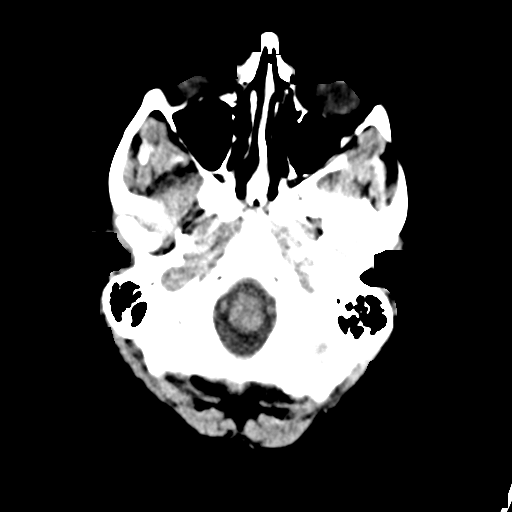
[im 4/31  bone]
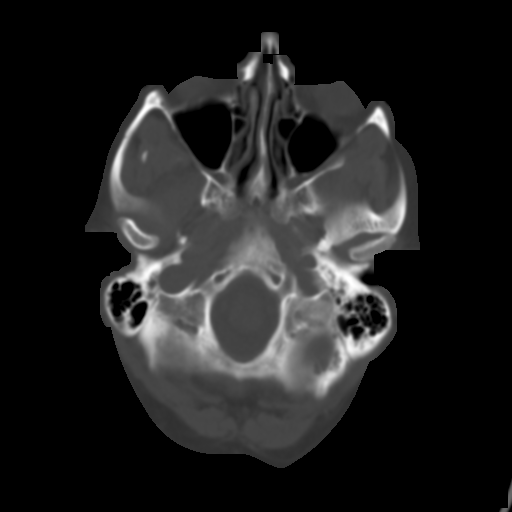
[im 8/31  brain]
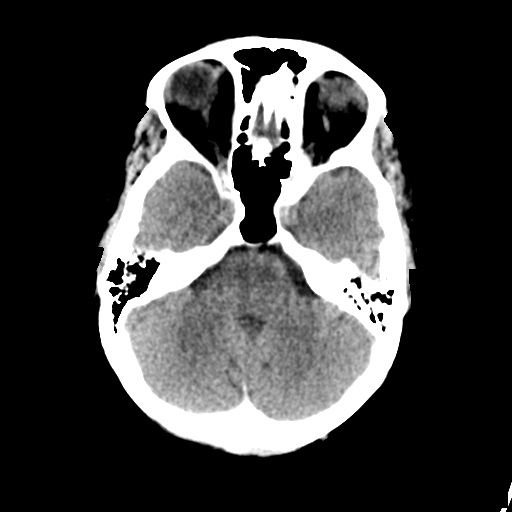
[im 12/31  brain]
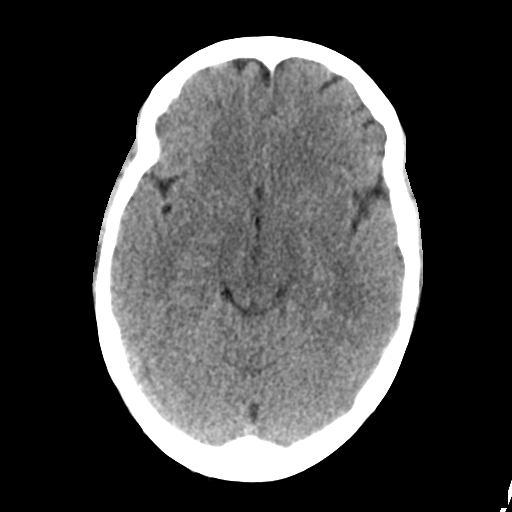
[im 16/31  brain]
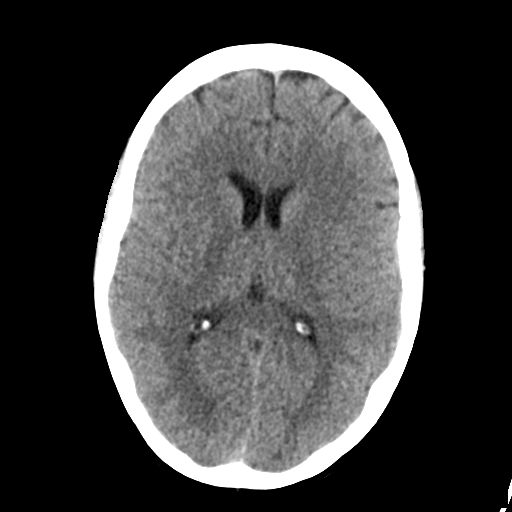
[im 19/31  brain]
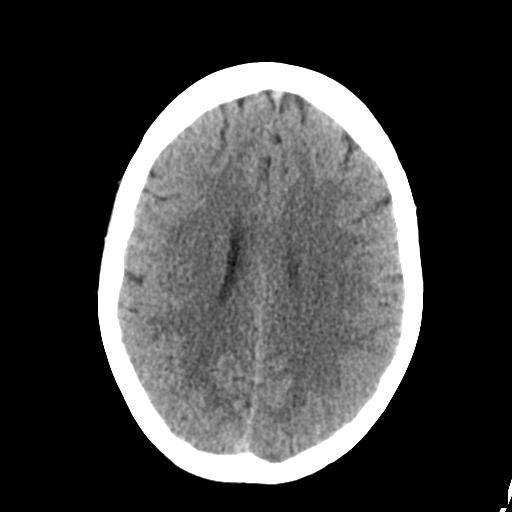
[im 19/31  bone]
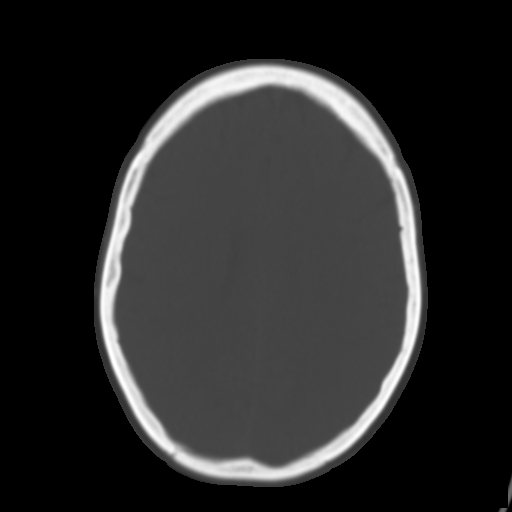
[im 23/31  brain]
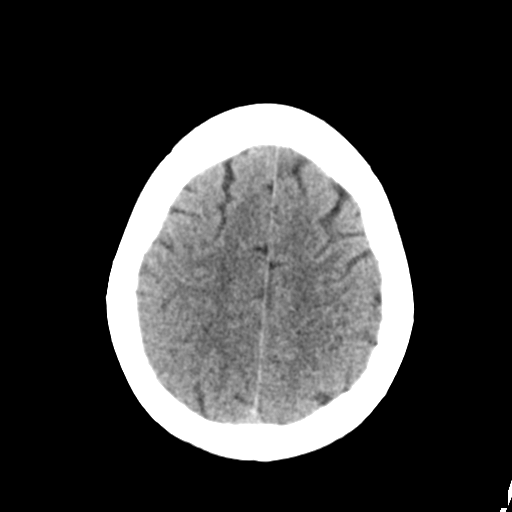
[im 27/31  brain]
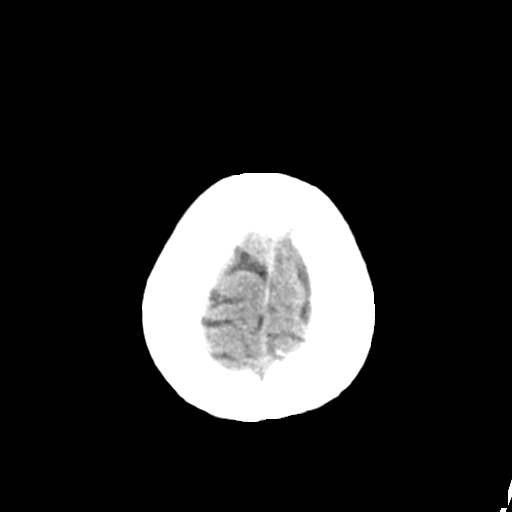

[Series 3: head bone · axial · 0.40mm/px · z∈[-122,-68]mm · 4 of 77 slices shown]
[im 8/77  bone]
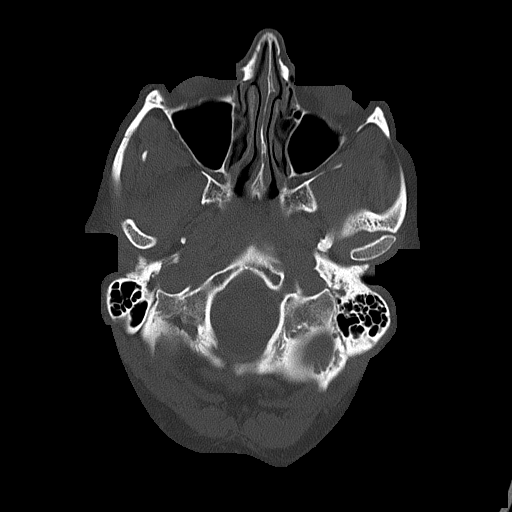
[im 16/77  bone]
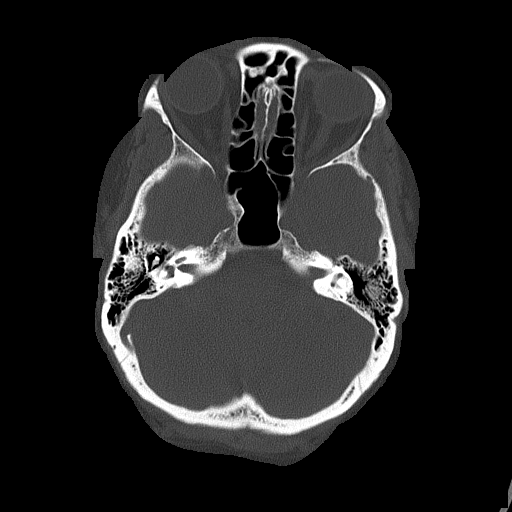
[im 23/77  bone]
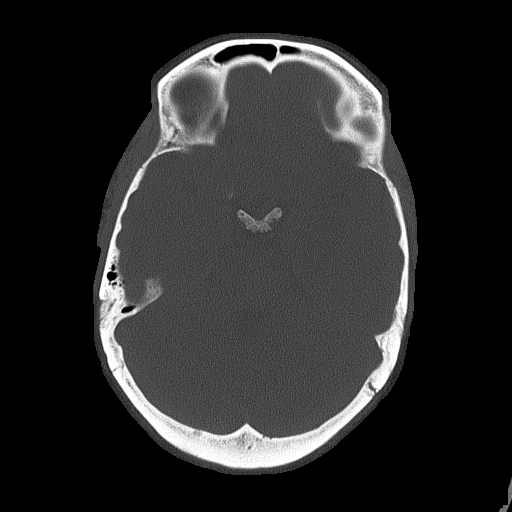
[im 35/77  bone]
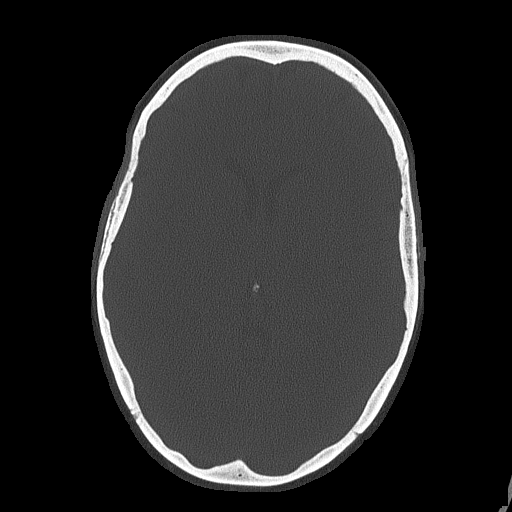

[Series 4: head without cor · coronal · non-contrast · 0.31mm/px · 3 of 67 slices shown]
[im 23/67  brain]
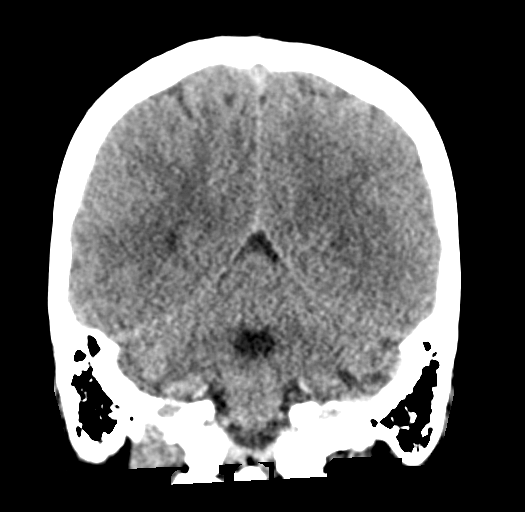
[im 30/67  brain]
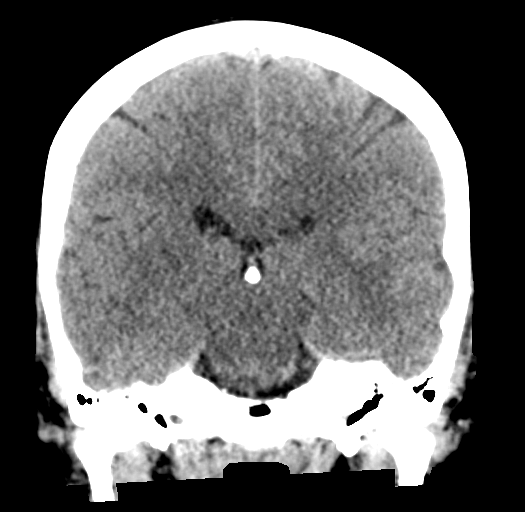
[im 37/67  brain]
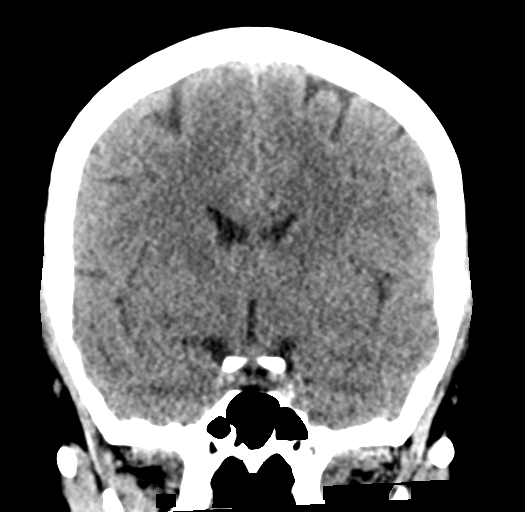

[Series 5: head without sag · sagittal · non-contrast · 0.31mm/px · 3 of 50 slices shown]
[im 17/50  brain]
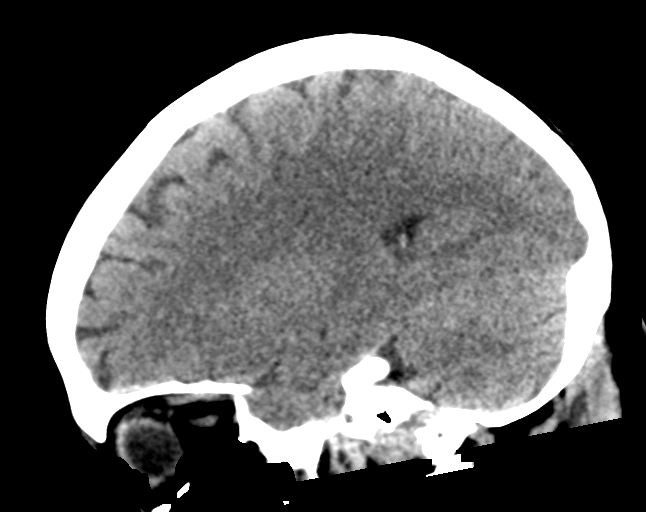
[im 25/50  brain]
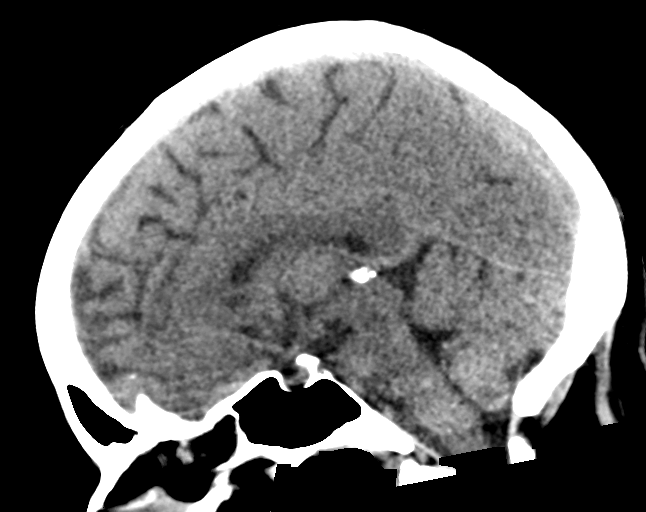
[im 33/50  brain]
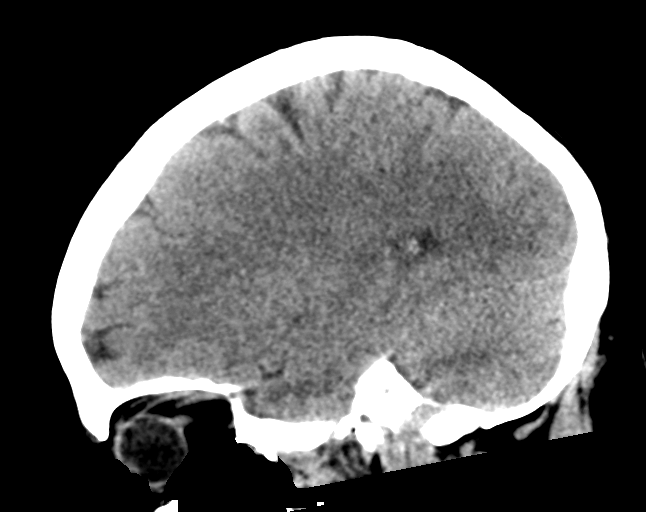

[17 of 47 positions shown; findings below may reference images not displayed]

FINDINGS: Brain: No evidence of acute infarction, hemorrhage, hydrocephalus,
extra-axial collection or mass lesion/mass effect.

Vascular: No hyperdense vessel or unexpected calcification.

Skull: No osseous abnormality.

Sinuses/Orbits: Visualized paranasal sinuses are clear. Visualized
mastoid sinuses are clear. Visualized orbits demonstrate no focal
abnormality.

Other: None
IMPRESSION: No acute intracranial pathology.

## 2017-10-18 IMAGING — CR DG CHEST 1V PORT
1 series · 1 of 1 positions shown · non-contrast
Comparison: None.

CLINICAL DATA: Headache and cough.  Fever.

EXAM:
PORTABLE CHEST 1 VIEW

[ap portable]
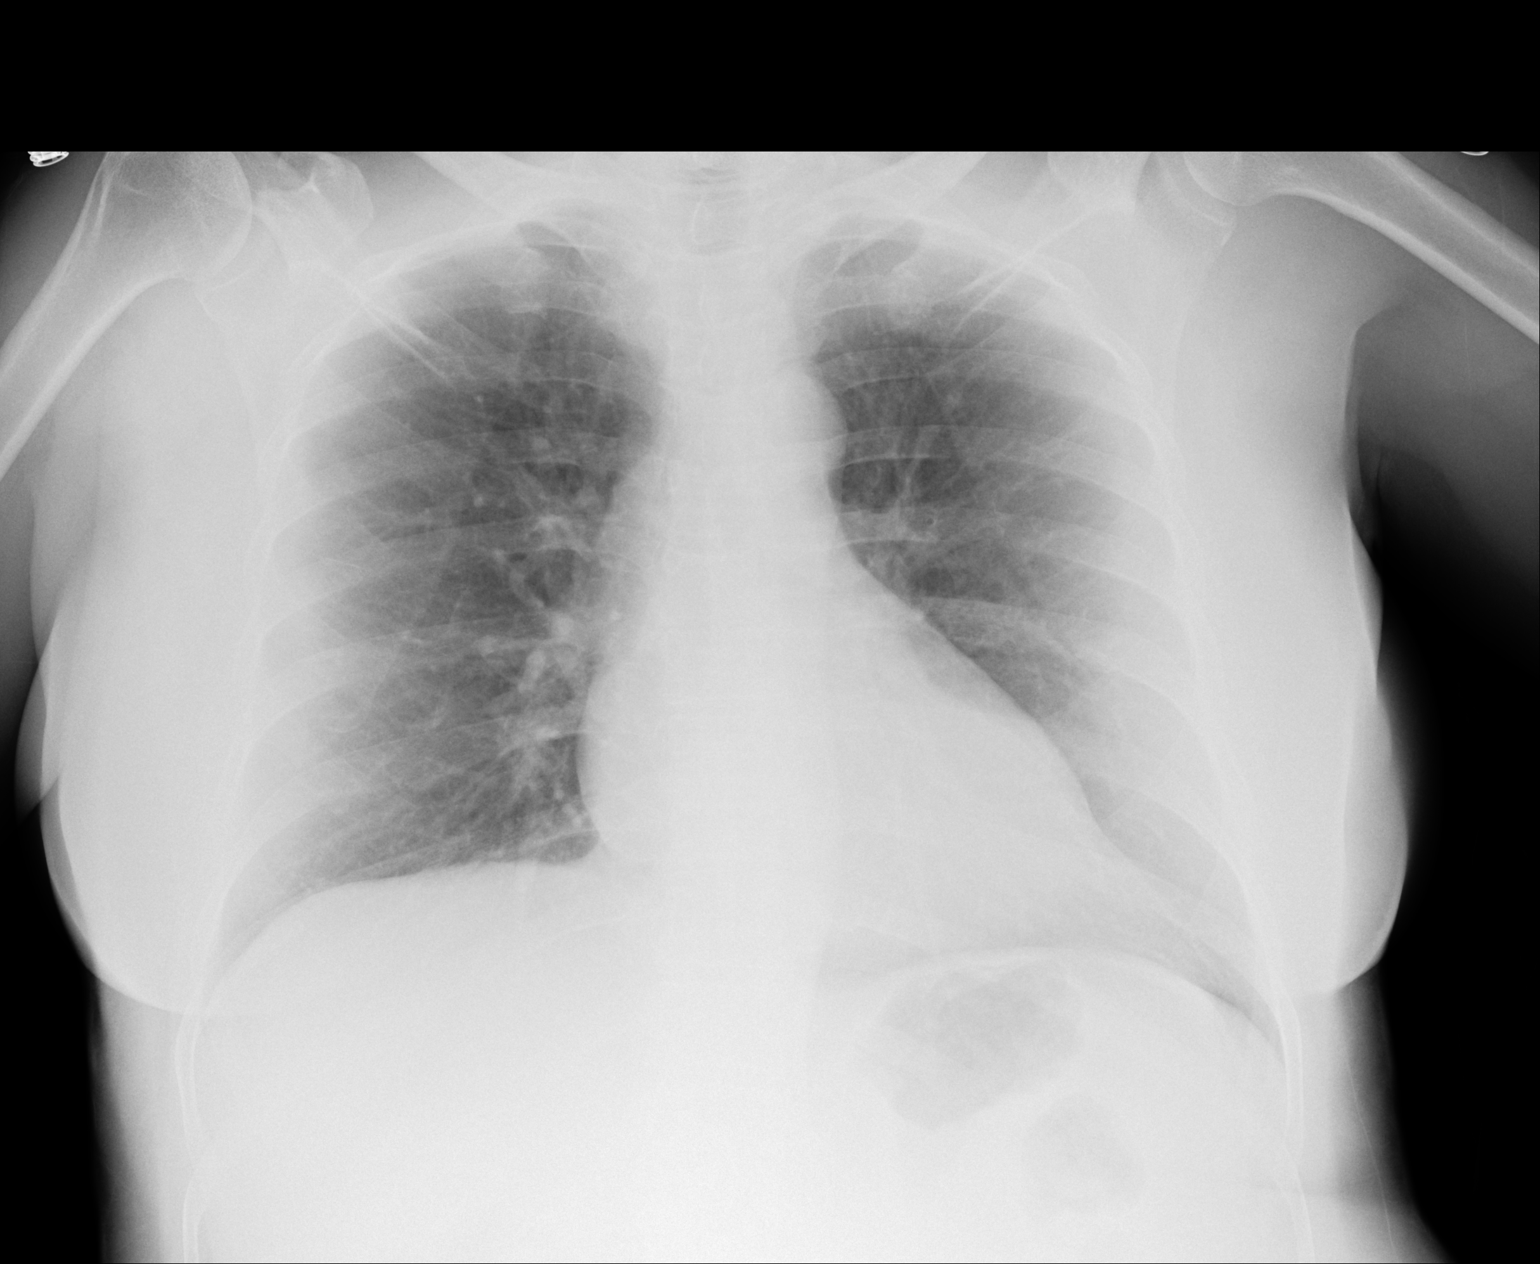

[1 of 1 positions shown; findings below may reference images not displayed]

FINDINGS: Cardiomediastinal silhouette is normal. No pleural effusions or
focal consolidations. Trachea projects midline and there is no
pneumothorax. Soft tissue planes and included osseous structures are
non-suspicious.
IMPRESSION: Normal chest.

## 2019-05-15 DIAGNOSIS — Z0000 Encounter for general adult medical examination without abnormal findings: Secondary | ICD-10-CM | POA: Diagnosis not present

## 2019-05-15 DIAGNOSIS — Z6824 Body mass index (BMI) 24.0-24.9, adult: Secondary | ICD-10-CM | POA: Diagnosis not present

## 2019-06-16 DIAGNOSIS — Z1231 Encounter for screening mammogram for malignant neoplasm of breast: Secondary | ICD-10-CM | POA: Diagnosis not present

## 2019-06-19 DIAGNOSIS — Z23 Encounter for immunization: Secondary | ICD-10-CM | POA: Diagnosis not present

## 2019-06-27 DIAGNOSIS — D1722 Benign lipomatous neoplasm of skin and subcutaneous tissue of left arm: Secondary | ICD-10-CM | POA: Diagnosis not present

## 2019-06-27 DIAGNOSIS — L821 Other seborrheic keratosis: Secondary | ICD-10-CM | POA: Diagnosis not present

## 2019-06-27 DIAGNOSIS — L820 Inflamed seborrheic keratosis: Secondary | ICD-10-CM | POA: Diagnosis not present

## 2019-07-30 DIAGNOSIS — D1722 Benign lipomatous neoplasm of skin and subcutaneous tissue of left arm: Secondary | ICD-10-CM | POA: Diagnosis not present

## 2019-09-08 ENCOUNTER — Ambulatory Visit: Payer: Self-pay | Admitting: General Surgery

## 2019-09-25 ENCOUNTER — Other Ambulatory Visit: Payer: Self-pay

## 2019-09-25 ENCOUNTER — Encounter (HOSPITAL_BASED_OUTPATIENT_CLINIC_OR_DEPARTMENT_OTHER): Payer: Self-pay | Admitting: General Surgery

## 2019-09-25 NOTE — Progress Notes (Signed)
Spoke w/ via phone for pre-op interview---PT Lab needs dos----   none            Lab results------none COVID test ------09-29-2019 at 1100 am Arrive at -------830 am 10-02-2019 NPO after MN NO Solid Food.  Clear liquids from MN until---930 am then npo Medications to take morning of surgery -----none Diabetic medication -----n/a Patient Special Instructions -----none Pre-Op special Istructions -----none Patient verbalized understanding of instructions that were given at this phone interview. Patient denies shortness of breath, chest pain, fever, cough at this phone interview.

## 2019-09-29 ENCOUNTER — Other Ambulatory Visit (HOSPITAL_COMMUNITY)
Admission: RE | Admit: 2019-09-29 | Discharge: 2019-09-29 | Disposition: A | Discharge status: Home / Self Care | Payer: Medicare Other | Source: Ambulatory Visit | Attending: General Surgery | Admitting: General Surgery

## 2019-09-29 DIAGNOSIS — Z01812 Encounter for preprocedural laboratory examination: Secondary | ICD-10-CM | POA: Diagnosis not present

## 2019-09-29 DIAGNOSIS — Z20822 Contact with and (suspected) exposure to covid-19: Secondary | ICD-10-CM | POA: Insufficient documentation

## 2019-09-29 LAB — SARS CORONAVIRUS 2 (TAT 6-24 HRS): SARS Coronavirus 2: NEGATIVE

## 2019-10-02 ENCOUNTER — Encounter (HOSPITAL_BASED_OUTPATIENT_CLINIC_OR_DEPARTMENT_OTHER): Payer: Self-pay | Admitting: General Surgery

## 2019-10-02 ENCOUNTER — Other Ambulatory Visit: Payer: Self-pay

## 2019-10-02 ENCOUNTER — Encounter (HOSPITAL_BASED_OUTPATIENT_CLINIC_OR_DEPARTMENT_OTHER)
Admission: RE | Disposition: A | Discharge status: Home / Self Care | Payer: Self-pay | Source: Home / Self Care | Attending: General Surgery

## 2019-10-02 ENCOUNTER — Ambulatory Visit (HOSPITAL_BASED_OUTPATIENT_CLINIC_OR_DEPARTMENT_OTHER): Payer: Medicare Other | Admitting: Certified Registered"

## 2019-10-02 ENCOUNTER — Ambulatory Visit (HOSPITAL_BASED_OUTPATIENT_CLINIC_OR_DEPARTMENT_OTHER)
Admission: RE | Admit: 2019-10-02 | Discharge: 2019-10-02 | Disposition: A | Discharge status: Home / Self Care | Payer: Medicare Other | Attending: General Surgery | Admitting: General Surgery

## 2019-10-02 DIAGNOSIS — D1722 Benign lipomatous neoplasm of skin and subcutaneous tissue of left arm: Secondary | ICD-10-CM | POA: Insufficient documentation

## 2019-10-02 DIAGNOSIS — R2232 Localized swelling, mass and lump, left upper limb: Secondary | ICD-10-CM | POA: Diagnosis not present

## 2019-10-02 DIAGNOSIS — D171 Benign lipomatous neoplasm of skin and subcutaneous tissue of trunk: Secondary | ICD-10-CM | POA: Diagnosis not present

## 2019-10-02 HISTORY — PX: MASS EXCISION: SHX2000

## 2019-10-02 HISTORY — DX: Localized swelling, mass and lump, left upper limb: R22.32

## 2019-10-02 SURGERY — EXCISION MASS
Anesthesia: Monitor Anesthesia Care | Site: Arm Upper | Laterality: Left

## 2019-10-02 MED ORDER — CHLORHEXIDINE GLUCONATE CLOTH 2 % EX PADS: 6.0000 | MEDICATED_PAD | Freq: Once | CUTANEOUS | Status: DC

## 2019-10-02 MED ORDER — LIDOCAINE 2% (20 MG/ML) 5 ML SYRINGE
INTRAMUSCULAR | Status: AC
  Filled 2019-10-02: qty 5

## 2019-10-02 MED ORDER — CEFAZOLIN SODIUM-DEXTROSE 2-4 GM/100ML-% IV SOLN
2.0000 g | INTRAVENOUS | Status: AC
  Administered 2019-10-02: 2 g via INTRAVENOUS

## 2019-10-02 MED ORDER — KETOROLAC TROMETHAMINE 15 MG/ML IJ SOLN: 15.0000 mg | INTRAMUSCULAR | Status: DC

## 2019-10-02 MED ORDER — LIDOCAINE 2% (20 MG/ML) 5 ML SYRINGE
INTRAMUSCULAR | Status: DC | PRN
  Administered 2019-10-02: 40 mg via INTRAVENOUS

## 2019-10-02 MED ORDER — MIDAZOLAM HCL 2 MG/2ML IJ SOLN
INTRAMUSCULAR | Status: AC
  Filled 2019-10-02: qty 2

## 2019-10-02 MED ORDER — PROPOFOL 500 MG/50ML IV EMUL
INTRAVENOUS | Status: DC | PRN
  Administered 2019-10-02: 200 ug/kg/min via INTRAVENOUS

## 2019-10-02 MED ORDER — LACTATED RINGERS IV SOLN
INTRAVENOUS | Status: DC
  Administered 2019-10-02: 1000 mL via INTRAVENOUS

## 2019-10-02 MED ORDER — ONDANSETRON HCL 4 MG/2ML IJ SOLN: 4.0000 mg | Freq: Once | INTRAMUSCULAR | Status: DC | PRN

## 2019-10-02 MED ORDER — ENSURE PRE-SURGERY PO LIQD: 296.0000 mL | Freq: Once | ORAL | Status: DC

## 2019-10-02 MED ORDER — ACETAMINOPHEN 500 MG PO TABS
ORAL_TABLET | ORAL | Status: AC
  Filled 2019-10-02: qty 2

## 2019-10-02 MED ORDER — MEPERIDINE HCL 25 MG/ML IJ SOLN: 6.2500 mg | INTRAMUSCULAR | Status: DC | PRN

## 2019-10-02 MED ORDER — MIDAZOLAM HCL 2 MG/2ML IJ SOLN
INTRAMUSCULAR | Status: DC | PRN
  Administered 2019-10-02: 1 mg via INTRAVENOUS

## 2019-10-02 MED ORDER — PROPOFOL 10 MG/ML IV BOLUS
INTRAVENOUS | Status: DC | PRN
  Administered 2019-10-02: 40 mg via INTRAVENOUS

## 2019-10-02 MED ORDER — FENTANYL CITRATE (PF) 100 MCG/2ML IJ SOLN
INTRAMUSCULAR | Status: DC | PRN
Start: 2019-10-02 — End: 2019-10-02
  Administered 2019-10-02: 50 ug via INTRAVENOUS

## 2019-10-02 MED ORDER — ACETAMINOPHEN 500 MG PO TABS
1000.0000 mg | ORAL_TABLET | ORAL | Status: AC
  Administered 2019-10-02: 1000 mg via ORAL

## 2019-10-02 MED ORDER — BUPIVACAINE-EPINEPHRINE 0.5% -1:200000 IJ SOLN
INTRAMUSCULAR | Status: DC | PRN
  Administered 2019-10-02: 20 mL

## 2019-10-02 MED ORDER — HYDROMORPHONE HCL 1 MG/ML IJ SOLN: 0.2500 mg | INTRAMUSCULAR | Status: DC | PRN

## 2019-10-02 MED ORDER — FENTANYL CITRATE (PF) 100 MCG/2ML IJ SOLN
INTRAMUSCULAR | Status: AC
Start: 2019-10-02 — End: ?
  Filled 2019-10-02: qty 2

## 2019-10-02 MED ORDER — PROPOFOL 10 MG/ML IV BOLUS
INTRAVENOUS | Status: AC
  Filled 2019-10-02: qty 20

## 2019-10-02 MED ORDER — IBUPROFEN 800 MG PO TABS: 800.0000 mg | ORAL_TABLET | Freq: Three times a day (TID) | ORAL | 0 refills | Status: DC | PRN

## 2019-10-02 MED ORDER — CEFAZOLIN SODIUM-DEXTROSE 2-4 GM/100ML-% IV SOLN
INTRAVENOUS | Status: AC
  Filled 2019-10-02: qty 100

## 2019-10-02 SURGICAL SUPPLY — 64 items
ADH SKN CLS APL DERMABOND .7 (GAUZE/BANDAGES/DRESSINGS) ×1
APL PRP STRL LF DISP 70% ISPRP (MISCELLANEOUS) ×1
APL SKNCLS STERI-STRIP NONHPOA (GAUZE/BANDAGES/DRESSINGS)
BENZOIN TINCTURE PRP APPL 2/3 (GAUZE/BANDAGES/DRESSINGS) IMPLANT
BLADE CLIPPER SENSICLIP SURGIC (BLADE) IMPLANT
BLADE HEX COATED 2.75 (ELECTRODE) ×3 IMPLANT
BLADE SURG 15 STRL LF DISP TIS (BLADE) ×1 IMPLANT
BLADE SURG 15 STRL SS (BLADE) ×3
BNDG COHESIVE 3X5 TAN STRL LF (GAUZE/BANDAGES/DRESSINGS) ×3 IMPLANT
BNDG GAUZE ELAST 4 BULKY (GAUZE/BANDAGES/DRESSINGS) IMPLANT
CHLORAPREP W/TINT 26 (MISCELLANEOUS) ×3 IMPLANT
CLOSURE WOUND 1/2 X4 (GAUZE/BANDAGES/DRESSINGS)
COVER BACK TABLE 60X90IN (DRAPES) ×3 IMPLANT
COVER MAYO STAND STRL (DRAPES) ×3 IMPLANT
COVER WAND RF STERILE (DRAPES) ×3 IMPLANT
DECANTER SPIKE VIAL GLASS SM (MISCELLANEOUS) IMPLANT
DERMABOND ADVANCED (GAUZE/BANDAGES/DRESSINGS) ×2
DERMABOND ADVANCED .7 DNX12 (GAUZE/BANDAGES/DRESSINGS) ×1 IMPLANT
DRAIN PENROSE 0.25X18 (DRAIN) IMPLANT
DRAIN PENROSE 0.5X18 (DRAIN) IMPLANT
DRAPE EXTREMITY T 121X128X90 (DISPOSABLE) ×3 IMPLANT
DRAPE LAPAROTOMY 100X72 PEDS (DRAPES) ×3 IMPLANT
DRAPE SHEET LG 3/4 BI-LAMINATE (DRAPES) ×3 IMPLANT
DRAPE UTILITY XL STRL (DRAPES) ×3 IMPLANT
DRSG TEGADERM 4X4.75 (GAUZE/BANDAGES/DRESSINGS) IMPLANT
ELECT COATED BLADE 2.86 ST (ELECTRODE) IMPLANT
ELECT REM PT RETURN 9FT ADLT (ELECTROSURGICAL) ×3
ELECTRODE REM PT RTRN 9FT ADLT (ELECTROSURGICAL) ×1 IMPLANT
GAUZE SPONGE 4X4 12PLY STRL (GAUZE/BANDAGES/DRESSINGS) IMPLANT
GLOVE BIOGEL PI IND STRL 7.0 (GLOVE) ×1 IMPLANT
GLOVE BIOGEL PI IND STRL 7.5 (GLOVE) ×2 IMPLANT
GLOVE BIOGEL PI INDICATOR 7.0 (GLOVE) ×2
GLOVE BIOGEL PI INDICATOR 7.5 (GLOVE) ×4
GLOVE ECLIPSE 7.5 STRL STRAW (GLOVE) ×3 IMPLANT
GLOVE SURG SS PI 7.0 STRL IVOR (GLOVE) ×3 IMPLANT
GOWN STRL REUS W/TWL LRG LVL3 (GOWN DISPOSABLE) ×6 IMPLANT
KIT TURNOVER CYSTO (KITS) ×3 IMPLANT
NEEDLE HYPO 25X1 1.5 SAFETY (NEEDLE) ×3 IMPLANT
NS IRRIG 500ML POUR BTL (IV SOLUTION) IMPLANT
PACK BASIN DAY SURGERY FS (CUSTOM PROCEDURE TRAY) ×3 IMPLANT
PENCIL SMOKE EVACUATOR (MISCELLANEOUS) ×3 IMPLANT
SPONGE LAP 4X18 RFD (DISPOSABLE) IMPLANT
STOCKINETTE IMPERVIOUS LG (DRAPES) ×3 IMPLANT
STRIP CLOSURE SKIN 1/2X4 (GAUZE/BANDAGES/DRESSINGS) IMPLANT
SUCTION FRAZIER HANDLE 10FR (MISCELLANEOUS)
SUCTION TUBE FRAZIER 10FR DISP (MISCELLANEOUS) IMPLANT
SUT ETHILON 3 0 FSL (SUTURE) ×3 IMPLANT
SUT MNCRL AB 4-0 PS2 18 (SUTURE) ×3 IMPLANT
SUT SILK 3 0 TIES 17X18 (SUTURE)
SUT SILK 3-0 18XBRD TIE BLK (SUTURE) IMPLANT
SUT VIC AB 2-0 SH 27 (SUTURE)
SUT VIC AB 2-0 SH 27XBRD (SUTURE) IMPLANT
SUT VIC AB 3-0 SH 27 (SUTURE)
SUT VIC AB 3-0 SH 27X BRD (SUTURE) IMPLANT
SUT VIC AB 3-0 SH 8-18 (SUTURE) ×3 IMPLANT
SWAB COLLECTION DEVICE MRSA (MISCELLANEOUS) IMPLANT
SWAB CULTURE ESWAB REG 1ML (MISCELLANEOUS) IMPLANT
SYR BULB EAR ULCER 2OZ BL STRL (SYRINGE) ×3 IMPLANT
SYR BULB IRRIG 60ML STRL (SYRINGE) IMPLANT
SYR CONTROL 10ML LL (SYRINGE) ×3 IMPLANT
TOWEL OR 17X26 10 PK STRL BLUE (TOWEL DISPOSABLE) ×3 IMPLANT
TUBE CONNECTING 12'X1/4 (SUCTIONS) ×1
TUBE CONNECTING 12X1/4 (SUCTIONS) ×2 IMPLANT
YANKAUER SUCT BULB TIP NO VENT (SUCTIONS) IMPLANT

## 2019-10-02 NOTE — H&P (Signed)
Courtney Wallace is an 64 y.o. female.   Chief Complaint: left arm mass HPI: 64 yo female with long history of mass on her arm. It is not tender. It has never been infected or had drainage. It has slowly increased in size.  Past Medical History:  Diagnosis Date  . Arm mass, left   . GERD (gastroesophageal reflux disease)     Past Surgical History:  Procedure Laterality Date  . COLONOSCOPY  08/30/2010   Procedure: COLONOSCOPY;  Surgeon: Jamesetta So;  [facility];  Service: Gastroenterology;  Laterality: N/A;  . DILATION AND CURETTAGE OF UTERUS  yrs ago  . TONSILLECTOMY  age 56    History reviewed. No pertinent family history. Social History:  reports that she has never smoked. She has never used smokeless tobacco. She reports that she does not drink alcohol and does not use drugs.  Allergies:  Allergies  Allergen Reactions  . Penicillins Rash    Has patient had a PCN reaction causing immediate rash, facial/tongue/throat swelling, SOB or lightheadedness with hypotension: Yes Has patient had a PCN reaction causing severe rash involving mucus membranes or skin necrosis: No Has patient had a PCN reaction that required hospitalization No Has patient had a PCN reaction occurring within the last 10 years: No If all of the above answers are "NO", then may proceed with Cephalosporin use.     No medications prior to admission.    No results found for this or any previous visit (from the past 48 hour(s)). No results found.  Review of Systems  Constitutional: Negative for chills and fever.  HENT: Negative for hearing loss.   Respiratory: Negative for cough.   Cardiovascular: Negative for chest pain and palpitations.  Gastrointestinal: Negative for abdominal pain, nausea and vomiting.  Genitourinary: Negative for dysuria and urgency.  Musculoskeletal: Negative for myalgias and neck pain.  Skin: Negative for rash.  Neurological: Negative for dizziness and headaches.   Hematological: Does not bruise/bleed easily.  Psychiatric/Behavioral: Negative for suicidal ideas.   Blood pressure (!) 124/55, pulse 70, temperature 98.1 F (36.7 C), temperature source Oral, resp. rate 14, height 5\' 2"  (1.575 m), weight 65.9 kg, last menstrual period 02/06/2013, SpO2 100 %. Physical Exam Vitals reviewed.  Constitutional:      Appearance: She is well-developed.  HENT:     Head: Normocephalic and atraumatic.  Eyes:     Conjunctiva/sclera: Conjunctivae normal.     Pupils: Pupils are equal, round, and reactive to light.  Cardiovascular:     Rate and Rhythm: Normal rate and regular rhythm.  Pulmonary:     Effort: Pulmonary effort is normal.     Breath sounds: Normal breath sounds.  Abdominal:     General: Bowel sounds are normal. There is no distension.     Palpations: Abdomen is soft.     Tenderness: There is no abdominal tenderness.  Musculoskeletal:        General: Normal range of motion.     Cervical back: Normal range of motion and neck supple.  Skin:    General: Skin is warm and dry.     Comments: Left arm mass  Neurological:     Mental Status: She is alert and oriented to person, place, and time.  Psychiatric:        Behavior: Behavior normal.    Assessment/Plan 64 yo female with enlarging mass of left arm -excision of left arm mass -outpatient procedure  Mickeal Skinner, MD 10/02/2019, 10:01 AM

## 2019-10-02 NOTE — Anesthesia Preprocedure Evaluation (Signed)
Anesthesia Evaluation  Patient identified by MRN, date of birth, ID band Patient awake    Reviewed: Allergy & Precautions, NPO status , Patient's Chart, lab work & pertinent test results  Airway Mallampati: I  TM Distance: >3 FB Neck ROM: Full    Dental   Pulmonary    Pulmonary exam normal        Cardiovascular Normal cardiovascular exam     Neuro/Psych    GI/Hepatic GERD  Medicated and Controlled,  Endo/Other    Renal/GU      Musculoskeletal   Abdominal   Peds  Hematology   Anesthesia Other Findings   Reproductive/Obstetrics                             Anesthesia Physical Anesthesia Plan  ASA: II  Anesthesia Plan: MAC   Post-op Pain Management:    Induction: Intravenous  PONV Risk Score and Plan: 2 and Treatment may vary due to age or medical condition  Airway Management Planned: Nasal Cannula  Additional Equipment:   Intra-op Plan:   Post-operative Plan:   Informed Consent: I have reviewed the patients History and Physical, chart, labs and discussed the procedure including the risks, benefits and alternatives for the proposed anesthesia with the patient or authorized representative who has indicated his/her understanding and acceptance.       Plan Discussed with: CRNA and Surgeon  Anesthesia Plan Comments:         Anesthesia Quick Evaluation

## 2019-10-02 NOTE — Discharge Instructions (Signed)
  Managing Your Pain After Surgery Without Opioids    Thank you for participating in our program to help patients manage their pain after surgery without opioids. This is part of our effort to provide you with the best care possible, without exposing you or your family to the risk that opioids pose.  What pain can I expect after surgery? You can expect to have some pain after surgery. This is normal. The pain is typically worse the day after surgery, and quickly begins to get better. Many studies have found that many patients are able to manage their pain after surgery with Over-the-Counter (OTC) medications such as Tylenol and Motrin. If you have a condition that does not allow you to take Tylenol or Motrin, notify your surgical team.  How will I manage my pain? The best strategy for controlling your pain after surgery is around the clock pain control with Tylenol (acetaminophen) and Motrin (ibuprofen or Advil). Alternating these medications with each other allows you to maximize your pain control. In addition to Tylenol and Motrin, you can use heating pads or ice packs on your incisions to help reduce your pain.  How will I alternate your regular strength over-the-counter pain medication? You will take a dose of pain medication every three hours. Start by taking 650 mg of Tylenol (2 pills of 325 mg) 3 hours later take 600 mg of Motrin (3 pills of 200 mg) 3 hours after taking the Motrin take 650 mg of Tylenol 3 hours after that take 600 mg of Motrin.   - 1 -  See example - if your first dose of Tylenol is at 12:00 PM   12:00 PM Tylenol 650 mg (2 pills of 325 mg)  3:00 PM Motrin 600 mg (3 pills of 200 mg)  6:00 PM Tylenol 650 mg (2 pills of 325 mg)  9:00 PM Motrin 600 mg (3 pills of 200 mg)  Continue alternating every 3 hours   We recommend that you follow this schedule around-the-clock for at least 3 days after surgery, or until you feel that it is no longer needed. Use the table  on the last page of this handout to keep track of the medications you are taking. Important: Do not take more than 3000mg of Tylenol or 3200mg of Motrin in a 24-hour period. Do not take ibuprofen/Motrin if you have a history of bleeding stomach ulcers, severe kidney disease, &/or actively taking a blood thinner  What if I still have pain? If you have pain that is not controlled with the over-the-counter pain medications (Tylenol and Motrin or Advil) you might have what we call "breakthrough" pain. You will receive a prescription for a small amount of an opioid pain medication such as Oxycodone, Tramadol, or Tylenol with Codeine. Use these opioid pills in the first 24 hours after surgery if you have breakthrough pain. Do not take more than 1 pill every 4-6 hours.  If you still have uncontrolled pain after using all opioid pills, don't hesitate to call our staff using the number provided. We will help make sure you are managing your pain in the best way possible, and if necessary, we can provide a prescription for additional pain medication.   Day 1    Time  Name of Medication Number of pills taken  Amount of Acetaminophen  Pain Level   Comments  AM PM       AM PM       AM PM         AM PM       AM PM       AM PM       AM PM       AM PM       Total Daily amount of Acetaminophen Do not take more than  3,000 mg per day      Day 2    Time  Name of Medication Number of pills taken  Amount of Acetaminophen  Pain Level   Comments  AM PM       AM PM       AM PM       AM PM       AM PM       AM PM       AM PM       AM PM       Total Daily amount of Acetaminophen Do not take more than  3,000 mg per day      Day 3    Time  Name of Medication Number of pills taken  Amount of Acetaminophen  Pain Level   Comments  AM PM       AM PM       AM PM       AM PM         AM PM       AM PM       AM PM       AM PM       Total Daily amount of Acetaminophen Do not take more  than  3,000 mg per day      Day 4    Time  Name of Medication Number of pills taken  Amount of Acetaminophen  Pain Level   Comments  AM PM       AM PM       AM PM       AM PM       AM PM       AM PM       AM PM       AM PM       Total Daily amount of Acetaminophen Do not take more than  3,000 mg per day      Day 5    Time  Name of Medication Number of pills taken  Amount of Acetaminophen  Pain Level   Comments  AM PM       AM PM       AM PM       AM PM       AM PM       AM PM       AM PM       AM PM       Total Daily amount of Acetaminophen Do not take more than  3,000 mg per day      Day 6    Time  Name of Medication Number of pills taken  Amount of Acetaminophen  Pain Level  Comments  AM PM       AM PM       AM PM       AM PM       AM PM       AM PM       AM PM       AM PM       Total Daily amount of Acetaminophen Do not take more than  3,000 mg   Acetaminophen Do not take more than  3,000 mg per day      Day 7    Time  Name of Medication Number of pills taken  Amount of Acetaminophen  Pain Level   Comments  AM PM       AM PM       AM PM       AM PM       AM PM       AM PM       AM PM       AM PM       Total Daily amount of Acetaminophen Do not take more than  3,000 mg per day        For additional information about how and where to safely dispose of unused opioid medications - [employer]  Disclaimer: This document contains information and/or instructional materials adapted from [place] for the typical patient with your condition. It does not replace medical advice from your health care provider because your experience may differ from that of the typical patient. Talk to your health care provider if you have any questions about this document, your condition or your treatment plan. Adapted from [place] Instructions  Activity: Get plenty of rest for the remainder of  the day. A responsible adult should stay with you for 24 hours following the procedure.  For the next 24 hours, DO NOT: -Drive a car -Paediatric nurse -Drink alcoholic beverages -Take any medication unless instructed by your physician -Make any legal decisions or sign important papers.  Meals: Start with liquid foods such as gelatin or soup. Progress to regular foods as tolerated. Avoid greasy, spicy, heavy foods. If nausea and/or vomiting occur, drink only clear liquids until the nausea and/or vomiting subsides. Call your physician if vomiting continues.  Special Instructions/Symptoms: Your throat may feel dry or sore from the anesthesia or the breathing tube placed in your throat during surgery. If this causes discomfort, gargle with warm salt water. The discomfort should disappear within 24 hours.  If you had a scopolamine patch placed behind your ear for the management of post- operative nausea and/or vomiting:  1. The medication in the patch is effective for 72 hours, after which it should be removed.  Wrap patch in a tissue and discard in the trash. Wash hands thoroughly with soap and water. 2. You may remove the patch earlier than 72 hours if you experience unpleasant side effects which may include dry mouth, dizziness or visual disturbances. 3. Avoid touching the patch. Wash your hands with soap and water after contact with the patch.

## 2019-10-02 NOTE — Op Note (Signed)
Preoperative diagnosis: lipoma  Postoperative diagnosis: subfascial lipoma of left deltoid muscle   Procedure: excision of 4 cm left shoulder mass deep to deltoid fascia  Surgeon: Gurney Maxin, M.D.  Asst: noen  Anesthesia: MAC  Indications for procedure: Courtney Wallace is a 64 y.o. year old female with symptoms of pain and enlarging mass of left shoulder.  Description of procedure: The patient was brought into the operative suite. Anesthesia was administered with Monitored Local Anesthesia with Sedation. WHO checklist was applied. The patient was then placed in supine position with adducted left arm. The area was prepped and draped in the usual sterile fashion.  Next, marcaine was infused over the area. A longitudinal incision was made through the skin. Cautery was used to dissect through the subcutaneous tissue. Blunt dissection and cautery was used to dissect the mass free of surrounding attachments. The mass was removed in its entirety. A portion of the mass was deep to the fascia and was also completely removed. The wound was irrigated. The muscle and fascia was reapproximated with 3-0 vicryl in interrupting fashion. The incision was closed with 3-0 vicryl in interrupted fashion. A portion of the skin was removed for better alignment of closure. The skin was closed with 4-0 monocryl in running subcuticular stitch. Dermabond was put in place for dressing. The patient awoke from anesthesia and brought to pacu in stable condition. All counts were correct. The patient tolerated the procedure well.   Findings: lipoma with subfascial involvement  Specimen: left shoulder mass  Implant: none   Blood loss: 10 ml  Local anesthesia: 20 ml marcaine   Complications: none  Gurney Maxin, M.D. General, Bariatric, & Minimally Invasive Surgery [facility]

## 2019-10-02 NOTE — Anesthesia Postprocedure Evaluation (Signed)
Anesthesia Post Note  Patient: Courtney Wallace  Procedure(s) Performed: EXCISION LEFT ARM SUBCUTANEOUS MASS (Left Arm Upper)     Patient location during evaluation: PACU Anesthesia Type: MAC Level of consciousness: awake and alert Pain management: pain level controlled Vital Signs Assessment: post-procedure vital signs reviewed and stable Respiratory status: spontaneous breathing, nonlabored ventilation, respiratory function stable and patient connected to nasal cannula oxygen Cardiovascular status: stable and blood pressure returned to baseline Postop Assessment: no apparent nausea or vomiting Anesthetic complications: no   No complications documented.  Last Vitals:  Vitals:   10/02/19 1200 10/02/19 1252  BP: 118/71 (!) 128/59  Pulse: 74 (!) 56  Resp: 10 16  Temp:  (!) 36.3 C  SpO2: 96% 100%    Last Pain:  Vitals:   10/02/19 1252  TempSrc: Oral  PainSc:                  Hisae Decoursey DAVID

## 2019-10-02 NOTE — Transfer of Care (Signed)
Immediate Anesthesia Transfer of Care Note  Patient: Courtney Wallace  Procedure(s) Performed: Procedure(s) (LRB): EXCISION LEFT ARM SUBCUTANEOUS MASS (Left)  Patient Location: PACU  Anesthesia Type: MAC  Level of Consciousness: awake, alert , oriented and patient cooperative  Airway & Oxygen Therapy: Patient Spontanous Breathing and Patient connected to face mask oxygen  Post-op Assessment: Report given to PACU RN and Post -op Vital signs reviewed and stable  Post vital signs: Reviewed and stable  Complications: No apparent anesthesia complications Last Vitals:  Vitals Value Taken Time  BP 107/63 10/02/19 1126  Temp    Pulse 80 10/02/19 1128  Resp 14 10/02/19 1128  SpO2 94 % 10/02/19 1128  Vitals shown include unvalidated device data.  Last Pain:  Vitals:   10/02/19 0954  TempSrc: Oral  PainSc: 0-No pain      Patients Stated Pain Goal: 5 (50/09/38 1829)  Complications: No complications documented.

## 2019-10-03 ENCOUNTER — Encounter (HOSPITAL_BASED_OUTPATIENT_CLINIC_OR_DEPARTMENT_OTHER): Payer: Self-pay | Admitting: General Surgery

## 2019-10-03 LAB — SURGICAL PATHOLOGY

## 2019-11-26 DIAGNOSIS — I781 Nevus, non-neoplastic: Secondary | ICD-10-CM | POA: Diagnosis not present

## 2019-11-26 DIAGNOSIS — L821 Other seborrheic keratosis: Secondary | ICD-10-CM | POA: Diagnosis not present

## 2019-11-26 DIAGNOSIS — L578 Other skin changes due to chronic exposure to nonionizing radiation: Secondary | ICD-10-CM | POA: Diagnosis not present

## 2019-11-26 DIAGNOSIS — L905 Scar conditions and fibrosis of skin: Secondary | ICD-10-CM | POA: Diagnosis not present

## 2022-01-09 DIAGNOSIS — M545 Low back pain, unspecified: Secondary | ICD-10-CM | POA: Diagnosis not present

## 2022-01-09 DIAGNOSIS — M25551 Pain in right hip: Secondary | ICD-10-CM | POA: Diagnosis not present

## 2022-01-09 DIAGNOSIS — M6281 Muscle weakness (generalized): Secondary | ICD-10-CM | POA: Diagnosis not present

## 2022-01-12 DIAGNOSIS — M25551 Pain in right hip: Secondary | ICD-10-CM | POA: Diagnosis not present

## 2022-01-12 DIAGNOSIS — M6281 Muscle weakness (generalized): Secondary | ICD-10-CM | POA: Diagnosis not present

## 2022-01-12 DIAGNOSIS — M545 Low back pain, unspecified: Secondary | ICD-10-CM | POA: Diagnosis not present

## 2022-01-13 DIAGNOSIS — M810 Age-related osteoporosis without current pathological fracture: Secondary | ICD-10-CM | POA: Diagnosis not present

## 2022-01-13 DIAGNOSIS — M8589 Other specified disorders of bone density and structure, multiple sites: Secondary | ICD-10-CM | POA: Diagnosis not present

## 2022-01-16 DIAGNOSIS — M6281 Muscle weakness (generalized): Secondary | ICD-10-CM | POA: Diagnosis not present

## 2022-01-16 DIAGNOSIS — M545 Low back pain, unspecified: Secondary | ICD-10-CM | POA: Diagnosis not present

## 2022-01-16 DIAGNOSIS — M25551 Pain in right hip: Secondary | ICD-10-CM | POA: Diagnosis not present

## 2022-01-18 DIAGNOSIS — M545 Low back pain, unspecified: Secondary | ICD-10-CM | POA: Diagnosis not present

## 2022-01-18 DIAGNOSIS — M25551 Pain in right hip: Secondary | ICD-10-CM | POA: Diagnosis not present

## 2022-01-18 DIAGNOSIS — M6281 Muscle weakness (generalized): Secondary | ICD-10-CM | POA: Diagnosis not present

## 2022-01-20 ENCOUNTER — Other Ambulatory Visit: Payer: Self-pay | Admitting: *Deleted

## 2022-01-20 DIAGNOSIS — Z1211 Encounter for screening for malignant neoplasm of colon: Secondary | ICD-10-CM

## 2022-01-23 DIAGNOSIS — M545 Low back pain, unspecified: Secondary | ICD-10-CM | POA: Diagnosis not present

## 2022-01-23 DIAGNOSIS — M6281 Muscle weakness (generalized): Secondary | ICD-10-CM | POA: Diagnosis not present

## 2022-01-23 DIAGNOSIS — M25551 Pain in right hip: Secondary | ICD-10-CM | POA: Diagnosis not present

## 2022-01-24 ENCOUNTER — Ambulatory Visit: Payer: Medicare Other | Admitting: Surgery

## 2022-01-24 ENCOUNTER — Encounter: Payer: Self-pay | Admitting: Surgery

## 2022-01-24 VITALS — BP 117/72 | HR 77 | Temp 97.4°F | Resp 12 | Ht 62.0 in | Wt 144.0 lb

## 2022-01-24 DIAGNOSIS — Z1211 Encounter for screening for malignant neoplasm of colon: Secondary | ICD-10-CM

## 2022-01-24 MED ORDER — SUTAB 1479-225-188 MG PO TABS: ORAL_TABLET | ORAL | 0 refills | Status: AC

## 2022-01-24 NOTE — Progress Notes (Signed)
[facility] History and Physical  Reason for Referral: Screening colonoscopy Referring Physician: Launa Grill, NP    Courtney Wallace is a 66 y.o. female.  HPI: Patient presents for evaluation for colonoscopy.  Her last colonoscopy was in 2012, and there were no abnormalities noted at that time.  That was her first and only colonoscopy.  She denies any issues with bowel movements, melena, or hematochezia.  She recently had a Cologuard test performed, which was negative.  She is tolerating a diet without nausea and vomiting and denies any abdominal pain.  She has no family history of colorectal cancer.  Her mother has a history of melanoma, and her sister has a history of breast cancer.  She is up-to-date on her mammograms and no abnormalities have been noted.  Her surgical history significant for left arm lipoma excision and tonsillectomy.  She is only taking medication for bursitis.  She denies any use of blood thinning medications.  She denies use of tobacco, alcohol, and illicit drugs.  Past Medical History:  Diagnosis Date   Arm mass, left    GERD (gastroesophageal reflux disease)     Past Surgical History:  Procedure Laterality Date   COLONOSCOPY  08/30/2010   Procedure: COLONOSCOPY;  Surgeon: Jamesetta So;  [facility];  Service: Gastroenterology;  Laterality: N/A;   DILATION AND CURETTAGE OF UTERUS  yrs ago   MASS EXCISION Left 10/02/2019   Procedure: EXCISION LEFT ARM SUBCUTANEOUS MASS;  Surgeon: Kinsinger, Arta Bruce, MD;  Location: [place];  Service: General;  Laterality: Left;   TONSILLECTOMY  age 86    No family history on file.  Social History   Tobacco Use   Smoking status: Never   Smokeless tobacco: Never  Vaping Use   Vaping Use: Never used  Substance Use Topics   Alcohol use: No   Drug use: No    Medications: I have reviewed the patient's current medications. Allergies as of 01/24/2022       Reactions    Penicillins Rash   Has patient had a PCN reaction causing immediate rash, facial/tongue/throat swelling, SOB or lightheadedness with hypotension: Yes Has patient had a PCN reaction causing severe rash involving mucus membranes or skin necrosis: No Has patient had a PCN reaction that required hospitalization No Has patient had a PCN reaction occurring within the last 10 years: No If all of the above answers are "NO", then may proceed with Cephalosporin use.        Medication List        Accurate as of January 24, 2022  1:18 PM. If you have any questions, ask your nurse or doctor.          ibuprofen 800 MG tablet Commonly known as: ADVIL Take 1 tablet (800 mg total) by mouth every 8 (eight) hours as needed.         ROS:  Constitutional: negative for chills, fatigue, and fevers Eyes: negative for visual disturbance and pain Ears, nose, mouth, throat, and face: negative for ear drainage, sore throat, and sinus problems Respiratory: negative for cough, wheezing, and shortness of breath Cardiovascular: negative for chest pain and palpitations Gastrointestinal: negative for abdominal pain, nausea, reflux symptoms, and vomiting Genitourinary:negative for dysuria, frequency, and urinary retention Integument/breast: negative for dryness and rash Hematologic/lymphatic: negative for bleeding and lymphadenopathy Musculoskeletal:positive for joint pain, negative for back pain and neck pain Neurological: negative for dizziness and tremors Endocrine: negative for temperature intolerance  Last menstrual  period 02/06/2013. Physical Exam Vitals reviewed.  Constitutional:      Appearance: Normal appearance.  HENT:     Head: Normocephalic and atraumatic.  Eyes:     Extraocular Movements: Extraocular movements intact.     Pupils: Pupils are equal, round, and reactive to light.  Cardiovascular:     Rate and Rhythm: Normal rate and regular rhythm.  Pulmonary:     Effort: Pulmonary  effort is normal.     Breath sounds: Normal breath sounds.  Abdominal:     Comments: Abdomen soft, nondistended, no percussion tenderness, nontender to palpation; no rigidity, guarding, rebound tenderness  Musculoskeletal:        General: Normal range of motion.     Cervical back: Normal range of motion.  Skin:    General: Skin is warm and dry.  Neurological:     General: No focal deficit present.     Mental Status: She is alert and oriented to person, place, and time.  Psychiatric:        Mood and Affect: Mood normal.        Behavior: Behavior normal.     Results: No results found for this or any previous visit (from the past 48 hour(s)).  No results found.   Assessment & Plan:  Courtney Wallace is a 66 y.o. female who presents to discuss colonoscopy.  -I discussed the reasoning for colonoscopy to screen for colorectal cancer. -The risk and benefits of colonoscopy were discussed including but not limited to bleeding, infection, missed lesions, and perforation requiring surgery.  After careful consideration, Courtney Wallace has decided to proceed with colonoscopy. -Patient tentatively scheduled for colonoscopy on 02/07/2022 -Information provided to the patient regarding colonoscopy -Sutab prep ordered, and instructions discussed and given to patient regarding administration of prep -Coupon provided for Sutab prep -BMP ordered.  Patient aware that this needs to be completed prior to colonoscopy -Advised patient to call the office if she has any issues with her prep  All questions were answered to the satisfaction of the patient and family.  Graciella Freer, DO [facility] [address] [place], [place] [address] [phone] (office)

## 2022-01-24 NOTE — H&P (Signed)
[facility] History and Physical  Reason for Referral: Screening colonoscopy Referring Physician: Launa Grill, NP    Courtney Wallace is a 66 y.o. female.  HPI: Patient presents for evaluation for colonoscopy.  Her last colonoscopy was in 2012, and there were no abnormalities noted at that time.  That was her first and only colonoscopy.  She denies any issues with bowel movements, melena, or hematochezia.  She recently had a Cologuard test performed, which was negative.  She is tolerating a diet without nausea and vomiting and denies any abdominal pain.  She has no family history of colorectal cancer.  Her mother has a history of melanoma, and her sister has a history of breast cancer.  She is up-to-date on her mammograms and no abnormalities have been noted.  Her surgical history significant for left arm lipoma excision and tonsillectomy.  She is only taking medication for bursitis.  She denies any use of blood thinning medications.  She denies use of tobacco, alcohol, and illicit drugs.  Past Medical History:  Diagnosis Date   Arm mass, left    GERD (gastroesophageal reflux disease)     Past Surgical History:  Procedure Laterality Date   COLONOSCOPY  08/30/2010   Procedure: COLONOSCOPY;  Surgeon: Jamesetta So;  [facility];  Service: Gastroenterology;  Laterality: N/A;   DILATION AND CURETTAGE OF UTERUS  yrs ago   MASS EXCISION Left 10/02/2019   Procedure: EXCISION LEFT ARM SUBCUTANEOUS MASS;  Surgeon: Kinsinger, Arta Bruce, MD;  Location: [place];  Service: General;  Laterality: Left;   TONSILLECTOMY  age 65    No family history on file.  Social History   Tobacco Use   Smoking status: Never   Smokeless tobacco: Never  Vaping Use   Vaping Use: Never used  Substance Use Topics   Alcohol use: No   Drug use: No    Medications: I have reviewed the patient's current medications. Allergies as of 01/24/2022       Reactions    Penicillins Rash   Has patient had a PCN reaction causing immediate rash, facial/tongue/throat swelling, SOB or lightheadedness with hypotension: Yes Has patient had a PCN reaction causing severe rash involving mucus membranes or skin necrosis: No Has patient had a PCN reaction that required hospitalization No Has patient had a PCN reaction occurring within the last 10 years: No If all of the above answers are "NO", then may proceed with Cephalosporin use.        Medication List        Accurate as of January 24, 2022  1:18 PM. If you have any questions, ask your nurse or doctor.          ibuprofen 800 MG tablet Commonly known as: ADVIL Take 1 tablet (800 mg total) by mouth every 8 (eight) hours as needed.         ROS:  Constitutional: negative for chills, fatigue, and fevers Eyes: negative for visual disturbance and pain Ears, nose, mouth, throat, and face: negative for ear drainage, sore throat, and sinus problems Respiratory: negative for cough, wheezing, and shortness of breath Cardiovascular: negative for chest pain and palpitations Gastrointestinal: negative for abdominal pain, nausea, reflux symptoms, and vomiting Genitourinary:negative for dysuria, frequency, and urinary retention Integument/breast: negative for dryness and rash Hematologic/lymphatic: negative for bleeding and lymphadenopathy Musculoskeletal:positive for joint pain, negative for back pain and neck pain Neurological: negative for dizziness and tremors Endocrine: negative for temperature intolerance  Last menstrual  period 02/06/2013. Physical Exam Vitals reviewed.  Constitutional:      Appearance: Normal appearance.  HENT:     Head: Normocephalic and atraumatic.  Eyes:     Extraocular Movements: Extraocular movements intact.     Pupils: Pupils are equal, round, and reactive to light.  Cardiovascular:     Rate and Rhythm: Normal rate and regular rhythm.  Pulmonary:     Effort: Pulmonary  effort is normal.     Breath sounds: Normal breath sounds.  Abdominal:     Comments: Abdomen soft, nondistended, no percussion tenderness, nontender to palpation; no rigidity, guarding, rebound tenderness  Musculoskeletal:        General: Normal range of motion.     Cervical back: Normal range of motion.  Skin:    General: Skin is warm and dry.  Neurological:     General: No focal deficit present.     Mental Status: She is alert and oriented to person, place, and time.  Psychiatric:        Mood and Affect: Mood normal.        Behavior: Behavior normal.     Results: No results found for this or any previous visit (from the past 48 hour(s)).  No results found.   Assessment & Plan:  Courtney Wallace is a 66 y.o. female who presents to discuss colonoscopy.  -I discussed the reasoning for colonoscopy to screen for colorectal cancer. -The risk and benefits of colonoscopy were discussed including but not limited to bleeding, infection, missed lesions, and perforation requiring surgery.  After careful consideration, Courtney Wallace has decided to proceed with colonoscopy. -Patient tentatively scheduled for colonoscopy on 02/07/2022 -Information provided to the patient regarding colonoscopy -Sutab prep ordered, and instructions discussed and given to patient regarding administration of prep -Coupon provided for Sutab prep -BMP ordered.  Patient aware that this needs to be completed prior to colonoscopy -Advised patient to call the office if she has any issues with her prep  All questions were answered to the satisfaction of the patient and family.  Graciella Freer, DO [facility] [address] [place], [place] [address] [phone] (office)

## 2022-01-25 DIAGNOSIS — M25551 Pain in right hip: Secondary | ICD-10-CM | POA: Diagnosis not present

## 2022-01-25 DIAGNOSIS — M6281 Muscle weakness (generalized): Secondary | ICD-10-CM | POA: Diagnosis not present

## 2022-01-25 DIAGNOSIS — M545 Low back pain, unspecified: Secondary | ICD-10-CM | POA: Diagnosis not present

## 2022-01-26 DIAGNOSIS — R748 Abnormal levels of other serum enzymes: Secondary | ICD-10-CM | POA: Diagnosis not present

## 2022-02-02 DIAGNOSIS — Z6823 Body mass index (BMI) 23.0-23.9, adult: Secondary | ICD-10-CM | POA: Diagnosis not present

## 2022-02-02 DIAGNOSIS — R5383 Other fatigue: Secondary | ICD-10-CM | POA: Diagnosis not present

## 2022-02-02 DIAGNOSIS — Z1159 Encounter for screening for other viral diseases: Secondary | ICD-10-CM | POA: Diagnosis not present

## 2022-02-02 DIAGNOSIS — M25551 Pain in right hip: Secondary | ICD-10-CM | POA: Diagnosis not present

## 2022-02-02 DIAGNOSIS — M545 Low back pain, unspecified: Secondary | ICD-10-CM | POA: Diagnosis not present

## 2022-02-02 DIAGNOSIS — M25559 Pain in unspecified hip: Secondary | ICD-10-CM | POA: Diagnosis not present

## 2022-02-02 DIAGNOSIS — R7303 Prediabetes: Secondary | ICD-10-CM | POA: Diagnosis not present

## 2022-02-02 DIAGNOSIS — R030 Elevated blood-pressure reading, without diagnosis of hypertension: Secondary | ICD-10-CM | POA: Diagnosis not present

## 2022-02-02 DIAGNOSIS — M6281 Muscle weakness (generalized): Secondary | ICD-10-CM | POA: Diagnosis not present

## 2022-02-02 DIAGNOSIS — R200 Anesthesia of skin: Secondary | ICD-10-CM | POA: Diagnosis not present

## 2022-02-02 DIAGNOSIS — Z1321 Encounter for screening for nutritional disorder: Secondary | ICD-10-CM | POA: Diagnosis not present

## 2022-02-02 DIAGNOSIS — M549 Dorsalgia, unspecified: Secondary | ICD-10-CM | POA: Diagnosis not present

## 2022-02-07 ENCOUNTER — Encounter (HOSPITAL_COMMUNITY)
Admission: RE | Disposition: A | Discharge status: Home / Self Care | Payer: Self-pay | Source: Home / Self Care | Attending: Surgery

## 2022-02-07 ENCOUNTER — Ambulatory Visit (HOSPITAL_BASED_OUTPATIENT_CLINIC_OR_DEPARTMENT_OTHER): Payer: Medicare Other | Admitting: Certified Registered Nurse Anesthetist

## 2022-02-07 ENCOUNTER — Ambulatory Visit (HOSPITAL_COMMUNITY)
Admission: RE | Admit: 2022-02-07 | Discharge: 2022-02-07 | Disposition: A | Discharge status: Home / Self Care | Payer: Medicare Other | Attending: Surgery | Admitting: Surgery

## 2022-02-07 ENCOUNTER — Other Ambulatory Visit: Payer: Self-pay

## 2022-02-07 ENCOUNTER — Encounter (HOSPITAL_COMMUNITY): Payer: Self-pay | Admitting: Surgery

## 2022-02-07 ENCOUNTER — Ambulatory Visit (HOSPITAL_COMMUNITY): Payer: Medicare Other | Admitting: Certified Registered Nurse Anesthetist

## 2022-02-07 DIAGNOSIS — K649 Unspecified hemorrhoids: Secondary | ICD-10-CM

## 2022-02-07 DIAGNOSIS — Z1211 Encounter for screening for malignant neoplasm of colon: Secondary | ICD-10-CM

## 2022-02-07 DIAGNOSIS — K219 Gastro-esophageal reflux disease without esophagitis: Secondary | ICD-10-CM | POA: Insufficient documentation

## 2022-02-07 DIAGNOSIS — Z803 Family history of malignant neoplasm of breast: Secondary | ICD-10-CM | POA: Diagnosis not present

## 2022-02-07 DIAGNOSIS — K635 Polyp of colon: Secondary | ICD-10-CM | POA: Diagnosis not present

## 2022-02-07 DIAGNOSIS — D123 Benign neoplasm of transverse colon: Secondary | ICD-10-CM

## 2022-02-07 HISTORY — PX: POLYPECTOMY: SHX5525

## 2022-02-07 HISTORY — PX: COLONOSCOPY WITH PROPOFOL: SHX5780

## 2022-02-07 SURGERY — COLONOSCOPY WITH PROPOFOL
Anesthesia: General

## 2022-02-07 MED ORDER — LIDOCAINE HCL (CARDIAC) PF 100 MG/5ML IV SOSY
PREFILLED_SYRINGE | INTRAVENOUS | Status: DC | PRN
  Administered 2022-02-07: 50 mg via INTRAVENOUS

## 2022-02-07 MED ORDER — LACTATED RINGERS IV SOLN: INTRAVENOUS | Status: DC | PRN

## 2022-02-07 MED ORDER — PROPOFOL 500 MG/50ML IV EMUL
INTRAVENOUS | Status: DC | PRN
  Administered 2022-02-07: 150 ug/kg/min via INTRAVENOUS

## 2022-02-07 MED ORDER — PROPOFOL 10 MG/ML IV BOLUS
INTRAVENOUS | Status: DC | PRN
  Administered 2022-02-07: 100 mg via INTRAVENOUS

## 2022-02-07 NOTE — Anesthesia Postprocedure Evaluation (Signed)
Anesthesia Post Note  Patient: Courtney Wallace  Procedure(s) Performed: COLONOSCOPY WITH PROPOFOL POLYPECTOMY  Patient location during evaluation: PACU Anesthesia Type: General Level of consciousness: awake and alert Pain management: pain level controlled Vital Signs Assessment: post-procedure vital signs reviewed and stable Respiratory status: spontaneous breathing, nonlabored ventilation, respiratory function stable and patient connected to nasal cannula oxygen Cardiovascular status: blood pressure returned to baseline and stable Postop Assessment: no apparent nausea or vomiting Anesthetic complications: no   There were no known notable events for this encounter.   Last Vitals:  Vitals:   02/07/22 0655 02/07/22 0807  BP: (!) 112/58 (!) 92/57  Pulse: 63 63  Resp: 13 18  Temp: 36.8 C 36.4 C  SpO2: 95% 94%    Last Pain:  Vitals:   02/07/22 0807  TempSrc: Axillary  PainSc: [place]

## 2022-02-07 NOTE — Transfer of Care (Signed)
Immediate Anesthesia Transfer of Care Note  Patient: Courtney Wallace  Procedure(s) Performed: COLONOSCOPY WITH PROPOFOL POLYPECTOMY  Patient Location: Endoscopy Unit  Anesthesia Type:General  Level of Consciousness: awake, alert , and oriented  Airway & Oxygen Therapy: Patient Spontanous Breathing  Post-op Assessment: Report given to RN and Post -op Vital signs reviewed and stable  Post vital signs: Reviewed and stable  Last Vitals:  Vitals Value Taken Time  BP 92/57 02/07/22 0807  Temp 36.4 C 02/07/22 0807  Pulse 63 02/07/22 0807  Resp 18 02/07/22 0807  SpO2 94 % 02/07/22 0807    Last Pain:  Vitals:   02/07/22 0807  TempSrc: Axillary  PainSc: 5       Patients Stated Pain Goal: 5 (49/17/91 5056)  Complications: No notable events documented.

## 2022-02-07 NOTE — Interval H&P Note (Signed)
History and Physical Interval Note:  02/07/2022 7:24 AM  Courtney Wallace  has presented today for surgery, with the diagnosis of SCREENING FOR COLON CANCER.  The various methods of treatment have been discussed with the patient and family. After consideration of risks, benefits and other options for treatment, the patient has consented to  Procedure(s): COLONOSCOPY WITH PROPOFOL (N/A) as a surgical intervention.  The patient's history has been reviewed, patient examined, no change in status, stable for surgery.  I have reviewed the patient's chart and labs.  Questions were answered to the patient's satisfaction.     [place]

## 2022-02-07 NOTE — Op Note (Signed)
[facility] Patient Name: Courtney Wallace Procedure Date: 02/07/2022 7:07 AM MRN: [id number] Date of Birth: 02-18-1955 Attending MD: Flint Melter Minnie Shi DO, ,  CSN: [id number] Age: 67 Admit Type: Outpatient Procedure:                Colonoscopy Indications:              Screening for colorectal malignant neoplasm Providers:                Barnetta Chapel A. Alayasia Breeding DO, Caprice Kluver, Everardo Pacific Referring MD:              Medicines:                Monitored Anesthesia Care Complications:            No immediate complications. Estimated Blood Loss:     Estimated blood loss: none. Procedure:                Pre-Anesthesia Assessment:                           - Prior to the procedure, a History and Physical                            was performed, and patient medications, allergies                            and sensitivities were reviewed. The patient's                            tolerance of previous anesthesia was reviewed.                           - The risks and benefits of the procedure and the                            sedation options and risks were discussed with the                            patient. All questions were answered and informed                            consent was obtained.                           - After reviewing the risks and benefits, the                            patient was deemed in satisfactory condition to                            undergo the procedure in an ambulatory setting.                           - Monitored anesthesia care under  the supervision                            of a CRNA was determined to be medically necessary                            for this procedure based on review of the patient's                            medical history, medications, and prior anesthesia                            history.                           After obtaining informed consent, the colonoscope                             was passed under direct vision. Throughout the                            procedure, the patient's blood pressure, pulse, and                            oxygen saturations were monitored continuously. The                            [id number]-HQ190L ([id number]) scope was introduced through                            the anus and advanced to the the cecum, identified                            by appendiceal orifice and ileocecal valve. The                            colonoscopy was performed without difficulty. The                            patient tolerated the procedure well. The quality                            of the bowel preparation was evaluated using the                            BBPS [facility] Bowel Preparation Scale) with scores                            of: Right Colon = 3, Transverse Colon = 3 and Left                            Colon = 3 (entire mucosa seen well with no residual  staining, small fragments of stool or opaque                            liquid). The total BBPS score equals 9. The                            ileocecal valve, the appendiceal orifice and the                            rectum were photographed. The entire colon was                            examined. Scope insertion time was 14 minutes.                            Scope withdrawal time was 12 minutes. The total                            duration of the procedure was 26 minutes. Scope In: 7:37:57 AM Scope Out: 8:03:58 AM Scope Withdrawal Time: 0 hours 12 minutes 49 seconds  Total Procedure Duration: 0 hours 26 minutes 1 second  Findings:      The perianal and digital rectal examinations were normal. Pertinent       negatives include normal sphincter tone.      A 4 mm polyp was found in the proximal transverse colon. The polyp was       sessile. The polyp was removed with a cold snare. Resection and       retrieval were complete.      The entire examined colon appeared normal  on direct and retroflexion       views. Impression:               - One 4 mm polyp in the proximal transverse colon,                            removed with a cold snare. Resected and retrieved.                           - The entire examined colon is normal on direct and                            retroflexion views. Moderate Sedation:      An independent trained observer was present and continuously monitored       the patient. Recommendation:           - Discharge patient to home (ambulatory).                           - Resume previous diet.                           - Continue present medications.                           - Await pathology results.                           -  Repeat colonoscopy for surveillance based on                            pathology results. Procedure Code(s):        --- Professional ---                           [phone], Colonoscopy, flexible; with removal of                            tumor(s), polyp(s), or other lesion(s) by snare                            technique Diagnosis Code(s):        --- Professional ---                           D12.3, Benign neoplasm of transverse colon (hepatic                            flexure or splenic flexure)                           Z12.11, Encounter for screening for malignant                            neoplasm of colon                           K64.9, Unspecified hemorrhoids CPT copyright 2022 American Medical Association. All rights reserved. The codes documented in this report are preliminary and upon coder review may  be revised to meet current compliance requirements. Barnetta Chapel A Kenidi Elenbaas DO,  02/07/2022 8:12:14 AM Number of Addenda: 0

## 2022-02-07 NOTE — Anesthesia Preprocedure Evaluation (Signed)
Anesthesia Evaluation  Patient identified by MRN, date of birth, ID band Patient awake    Reviewed: Allergy & Precautions, H&P , NPO status , Patient's Chart, lab work & pertinent test results  Airway Mallampati: II  TM Distance: >3 FB Neck ROM: Full    Dental no notable dental hx.    Pulmonary neg pulmonary ROS   Pulmonary exam normal breath sounds clear to auscultation       Cardiovascular negative cardio ROS Normal cardiovascular exam Rhythm:Regular Rate:Normal     Neuro/Psych negative neurological ROS  negative psych ROS   GI/Hepatic Neg liver ROS,GERD  ,,  Endo/Other  negative endocrine ROS    Renal/GU negative Renal ROS  negative genitourinary   Musculoskeletal negative musculoskeletal ROS (+)    Abdominal   Peds negative pediatric ROS (+)  Hematology negative hematology ROS (+)   Anesthesia Other Findings SCREENING FOR COLON CANCER  Reproductive/Obstetrics negative OB ROS                             Anesthesia Physical Anesthesia Plan  ASA: 2  Anesthesia Plan: General   Post-op Pain Management:    Induction: Intravenous  PONV Risk Score and Plan:   Airway Management Planned: Nasal Cannula and Natural Airway  Additional Equipment: None  Intra-op Plan:   Post-operative Plan: Extubation in OR  Informed Consent: I have reviewed the patients History and Physical, chart, labs and discussed the procedure including the risks, benefits and alternatives for the proposed anesthesia with the patient or authorized representative who has indicated his/her understanding and acceptance.     Dental advisory given  Plan Discussed with: CRNA  Anesthesia Plan Comments:        Anesthesia Quick Evaluation

## 2022-02-08 DIAGNOSIS — M6281 Muscle weakness (generalized): Secondary | ICD-10-CM | POA: Diagnosis not present

## 2022-02-08 DIAGNOSIS — M25551 Pain in right hip: Secondary | ICD-10-CM | POA: Diagnosis not present

## 2022-02-08 DIAGNOSIS — M545 Low back pain, unspecified: Secondary | ICD-10-CM | POA: Diagnosis not present

## 2022-02-08 LAB — SURGICAL PATHOLOGY

## 2022-02-09 ENCOUNTER — Telehealth (INDEPENDENT_AMBULATORY_CARE_PROVIDER_SITE_OTHER): Payer: Medicare Other | Admitting: Surgery

## 2022-02-09 DIAGNOSIS — D126 Benign neoplasm of colon, unspecified: Secondary | ICD-10-CM

## 2022-02-09 NOTE — Telephone Encounter (Signed)
[facility]  Called to update the patient regarding her pathology results after colonoscopy.  The polyp that was removed was noted to be a sessile serrated polyp.  It was 1.3 cm in size.  For this reason, I am recommending that she undergo repeat colonoscopy in 3 years.  Patient is doing well since colonoscopy, and she has no time.  All questions were answered to her expressed satisfaction.  Pathology: A. COLON, PROXIMAL TRANSVERSE, POLYPECTOMY:  - Sessile serrated polyp without cytologic dysplasia   Graciella Freer, DO [facility] [address] [place], [place] [address] [phone] (office)

## 2022-02-10 DIAGNOSIS — M25551 Pain in right hip: Secondary | ICD-10-CM | POA: Diagnosis not present

## 2022-02-10 DIAGNOSIS — M6281 Muscle weakness (generalized): Secondary | ICD-10-CM | POA: Diagnosis not present

## 2022-02-10 DIAGNOSIS — M545 Low back pain, unspecified: Secondary | ICD-10-CM | POA: Diagnosis not present

## 2022-02-13 DIAGNOSIS — M6281 Muscle weakness (generalized): Secondary | ICD-10-CM | POA: Diagnosis not present

## 2022-02-13 DIAGNOSIS — M25551 Pain in right hip: Secondary | ICD-10-CM | POA: Diagnosis not present

## 2022-02-13 DIAGNOSIS — M545 Low back pain, unspecified: Secondary | ICD-10-CM | POA: Diagnosis not present

## 2022-02-14 ENCOUNTER — Encounter (HOSPITAL_COMMUNITY): Payer: Self-pay | Admitting: Surgery

## 2022-02-15 DIAGNOSIS — M6281 Muscle weakness (generalized): Secondary | ICD-10-CM | POA: Diagnosis not present

## 2022-02-15 DIAGNOSIS — M25551 Pain in right hip: Secondary | ICD-10-CM | POA: Diagnosis not present

## 2022-02-15 DIAGNOSIS — M545 Low back pain, unspecified: Secondary | ICD-10-CM | POA: Diagnosis not present

## 2022-02-20 DIAGNOSIS — M545 Low back pain, unspecified: Secondary | ICD-10-CM | POA: Diagnosis not present

## 2022-02-20 DIAGNOSIS — M25551 Pain in right hip: Secondary | ICD-10-CM | POA: Diagnosis not present

## 2022-02-20 DIAGNOSIS — M6281 Muscle weakness (generalized): Secondary | ICD-10-CM | POA: Diagnosis not present

## 2022-02-23 DIAGNOSIS — M545 Low back pain, unspecified: Secondary | ICD-10-CM | POA: Diagnosis not present

## 2022-02-23 DIAGNOSIS — M6281 Muscle weakness (generalized): Secondary | ICD-10-CM | POA: Diagnosis not present

## 2022-02-23 DIAGNOSIS — M25551 Pain in right hip: Secondary | ICD-10-CM | POA: Diagnosis not present

## 2022-03-01 DIAGNOSIS — M9902 Segmental and somatic dysfunction of thoracic region: Secondary | ICD-10-CM | POA: Diagnosis not present

## 2022-03-01 DIAGNOSIS — M5432 Sciatica, left side: Secondary | ICD-10-CM | POA: Diagnosis not present

## 2022-03-01 DIAGNOSIS — M9903 Segmental and somatic dysfunction of lumbar region: Secondary | ICD-10-CM | POA: Diagnosis not present

## 2022-03-01 DIAGNOSIS — M25551 Pain in right hip: Secondary | ICD-10-CM | POA: Diagnosis not present

## 2022-03-01 DIAGNOSIS — M9905 Segmental and somatic dysfunction of pelvic region: Secondary | ICD-10-CM | POA: Diagnosis not present

## 2022-03-03 DIAGNOSIS — M25551 Pain in right hip: Secondary | ICD-10-CM | POA: Diagnosis not present

## 2022-03-03 DIAGNOSIS — M9905 Segmental and somatic dysfunction of pelvic region: Secondary | ICD-10-CM | POA: Diagnosis not present

## 2022-03-03 DIAGNOSIS — M5432 Sciatica, left side: Secondary | ICD-10-CM | POA: Diagnosis not present

## 2022-03-03 DIAGNOSIS — M9902 Segmental and somatic dysfunction of thoracic region: Secondary | ICD-10-CM | POA: Diagnosis not present

## 2022-03-03 DIAGNOSIS — M9903 Segmental and somatic dysfunction of lumbar region: Secondary | ICD-10-CM | POA: Diagnosis not present

## 2022-03-06 DIAGNOSIS — M9905 Segmental and somatic dysfunction of pelvic region: Secondary | ICD-10-CM | POA: Diagnosis not present

## 2022-03-06 DIAGNOSIS — M9902 Segmental and somatic dysfunction of thoracic region: Secondary | ICD-10-CM | POA: Diagnosis not present

## 2022-03-06 DIAGNOSIS — M5432 Sciatica, left side: Secondary | ICD-10-CM | POA: Diagnosis not present

## 2022-03-06 DIAGNOSIS — M9903 Segmental and somatic dysfunction of lumbar region: Secondary | ICD-10-CM | POA: Diagnosis not present

## 2022-03-06 DIAGNOSIS — M25551 Pain in right hip: Secondary | ICD-10-CM | POA: Diagnosis not present

## 2022-03-10 DIAGNOSIS — M5432 Sciatica, left side: Secondary | ICD-10-CM | POA: Diagnosis not present

## 2022-03-10 DIAGNOSIS — M9903 Segmental and somatic dysfunction of lumbar region: Secondary | ICD-10-CM | POA: Diagnosis not present

## 2022-03-10 DIAGNOSIS — M9902 Segmental and somatic dysfunction of thoracic region: Secondary | ICD-10-CM | POA: Diagnosis not present

## 2022-03-10 DIAGNOSIS — M25551 Pain in right hip: Secondary | ICD-10-CM | POA: Diagnosis not present

## 2022-03-10 DIAGNOSIS — M9905 Segmental and somatic dysfunction of pelvic region: Secondary | ICD-10-CM | POA: Diagnosis not present

## 2022-03-13 DIAGNOSIS — M25551 Pain in right hip: Secondary | ICD-10-CM | POA: Diagnosis not present

## 2022-03-13 DIAGNOSIS — M9903 Segmental and somatic dysfunction of lumbar region: Secondary | ICD-10-CM | POA: Diagnosis not present

## 2022-03-13 DIAGNOSIS — M9905 Segmental and somatic dysfunction of pelvic region: Secondary | ICD-10-CM | POA: Diagnosis not present

## 2022-03-13 DIAGNOSIS — M5432 Sciatica, left side: Secondary | ICD-10-CM | POA: Diagnosis not present

## 2022-03-13 DIAGNOSIS — M9902 Segmental and somatic dysfunction of thoracic region: Secondary | ICD-10-CM | POA: Diagnosis not present

## 2022-03-16 ENCOUNTER — Encounter: Payer: Self-pay | Admitting: *Deleted

## 2022-03-17 DIAGNOSIS — M9902 Segmental and somatic dysfunction of thoracic region: Secondary | ICD-10-CM | POA: Diagnosis not present

## 2022-03-17 DIAGNOSIS — M25551 Pain in right hip: Secondary | ICD-10-CM | POA: Diagnosis not present

## 2022-03-17 DIAGNOSIS — M5432 Sciatica, left side: Secondary | ICD-10-CM | POA: Diagnosis not present

## 2022-03-17 DIAGNOSIS — M9905 Segmental and somatic dysfunction of pelvic region: Secondary | ICD-10-CM | POA: Diagnosis not present

## 2022-03-17 DIAGNOSIS — M9903 Segmental and somatic dysfunction of lumbar region: Secondary | ICD-10-CM | POA: Diagnosis not present

## 2022-03-20 ENCOUNTER — Encounter: Payer: Self-pay | Admitting: Neurology

## 2022-03-20 ENCOUNTER — Ambulatory Visit: Payer: Medicare Other | Admitting: Neurology

## 2022-03-20 VITALS — BP 132/71 | HR 78 | Ht 61.0 in | Wt 139.4 lb

## 2022-03-20 DIAGNOSIS — G5712 Meralgia paresthetica, left lower limb: Secondary | ICD-10-CM | POA: Diagnosis not present

## 2022-03-20 DIAGNOSIS — M25551 Pain in right hip: Secondary | ICD-10-CM

## 2022-03-20 NOTE — Progress Notes (Signed)
Subjective:    Patient ID: [name] is a 67 y.o. female.  HPI    Star Age, MD, PhD [facility] [address], [address] [place], [place] [address]  Dear Dr. Jimmye Norman,  I saw your patient, Courtney Wallace, upon your kind request in my neurologic clinic today for initial consultation of her left leg numbness.  The patient is accompanied by her husband today.  As you know, Courtney Wallace is a 67 year old female with an underlying medical history of prediabetes, reflux disease, hyperlipidemia, right hip pain, low back pain, and arthritis, who reports numbness and tingling in the left lateral thigh area since early November 2023.  She reports that her symptoms started shortly after she started having significant right hip pain.  She started limping and ended up seeing orthopedics, she saw Dr. Sheliah Plane locally in [place], [place].  I do not have orthopedic records available for review.  She took a course of oral steroids which did not help and ended up getting a shot in her right hip for suspected bursitis, she recalls it lasted perhaps 24 hours.  She went through physical therapy for an extended period of time but did not make any progress.  She has been walking with a cane off and on.  She started seeing a chiropractor some 2 weeks ago.   She recalls that her symptoms in the left thigh area started a day after her shot in the hip on the right side.  She has not fallen.  She has had near falls though.  She describes a sensation of stinging in the left lateral thigh area, numbness and hypersensitivity.  Gabapentin helps but makes her very drowsy so she can only take it at bedtime, has not tried it during the day, she feels dizzy after taking it. She reports that she was previously active, she would even run for exercise, not long distance or fast. I reviewed your office note from 02/02/2022.  Of note, she saw neurosurgery for her low back pain and leg pain as well as  numbness on 12/22/2021.  She had an MRI of the lumbar spine without contrast through [facility] on 12/16/2021 and I reviewed the results: Impression: Mild degenerative changes of the lumbar spine, more pronounced at L4-5 where there is mild spinal canal stenosis with mild narrowing of the bilateral subarticular zones and mild bilateral neural foraminal narrowing.  She was started on gabapentin for symptomatic relief.  Of note, she has had elevated blood sugar values and her most recent A1c according to your office note was 6.1.  I was able to review blood test results from 01/26/2022.  CMP showed sodium of 141, potassium 4.5, BUN 14, creatinine 0.89, alk phos 58, AST 15, ALT 34.  On 12/19/2021 her hepatitis C antibodies were nonreactive.  Vitamin D level was below normal at 24.1.  She was advised to start taking over-the-counter vitamin D.  CBC with differential was benign, with the exception of mild increase in granulocytes and mild decrease in lymphocytes.  Lipid panel on 12/15/2021 showed elevated total cholesterol at 254, triglycerides elevated 243, LDL elevated at 145.   I had evaluated her for headaches in 2018, a brain MRI with and without contrast in March 2019 showed age-appropriate findings.  Previously:   04/20/16: 67 year old right-handed woman with an underlying medical history of reflux disease, who presented to the emergency room 3 times in the month of March 2018, with complaints of headache. I reviewed the  emergency room records from 04/10/2016, 04/13/2016 as well as 04/18/2016. She was treated symptomatically for headaches. Workup in the emergency room included CT head without contrast on 04/10/2016 which showed no acute intracranial pathology. She had a CT angiogram of the head on 04/13/2016 which I reviewed: IMPRESSION: 1. Negative CTA of the head. No aneurysm or vascular abnormality identified. No findings to explain patient's symptoms identified. 2. Relatively mild atherosclerotic  changes for patient age. No high-grade or correctable stenosis identified.   On 04/13/2016 she was treated in the emergency room with Compazine, Toradol, Ativan and IV fluids. She was given a prescription for hydrocodone. On 04/10/2016 she was treated symptomatically with Reglan, Benadryl, Decadron. She had a lumbar puncture on 04/13/2016 with benign findings. Total protein was 50. She had about 3 HA days, back to back that led to the ER visit on 04/10/16.  She had severe bioccipital HA, radiating forward to the bifrontal areas, sharp pain. This week, she realized, that she does not hear as well on the L. She has had photophobia, and N/V, no recently. She had severe neck pain which radiated upwards. She has no prior history of headaches, headaches started abruptly, this was an early in the evenings, she does not have morning headaches. She sleeps reasonably well, intermittent snoring. She has never had hearing loss but has noted recent hearing loss about 2 days ago on the left, no ringing in the ears. She denies any one-sided weakness, numbness, facial droop or drooling, or slurring of speech. A month ago she had a flu and was treated for it.    Her Past Medical History Is Significant For: Past Medical History:  Diagnosis Date   Arm mass, left    Bursitis    right hip   Dyslipidemia    Fatty liver    GERD (gastroesophageal reflux disease)    Prediabetes    Right hip pain    bursitis    All past Surgical History Is Significant For: Past Surgical History:  Procedure Laterality Date   COLONOSCOPY  08/30/2010   Procedure: COLONOSCOPY;  Surgeon: Jamesetta So;  [facility];  Service: Gastroenterology;  Laterality: N/A;   COLONOSCOPY WITH PROPOFOL N/A 02/07/2022   Procedure: COLONOSCOPY WITH PROPOFOL;  Surgeon: Rusty Aus, DO;  Location: [facility];  Service: General;  Laterality: N/A;   DILATION AND CURETTAGE OF UTERUS  yrs ago   MASS EXCISION Left 10/02/2019    Procedure: EXCISION LEFT ARM SUBCUTANEOUS MASS;  Surgeon: Kinsinger, Arta Bruce, MD;  Location: [facility];  Service: General;  Laterality: Left;   POLYPECTOMY  02/07/2022   Procedure: POLYPECTOMY;  Surgeon: Rusty Aus, DO;  Location: [facility];  Service: General;;   TONSILLECTOMY  age 43    Her Family History Is Significant For: Family History  Problem Relation Age of Onset   Dementia Mother    Melanoma Mother    Cirrhosis Father    Breast cancer Sister    Neuropathy Neg Hx     Her Social History Is Significant For: Social History   Socioeconomic History   Marital status: Married    Spouse name: Not on file   Number of children: Not on file   Years of education: Not on file   Highest education level: Not on file  Occupational History   Not on file  Tobacco Use   Smoking status: Never   Smokeless tobacco: Never  Vaping Use   Vaping Use: Never used  Substance and Sexual Activity   Alcohol use: No   Drug use: No   Sexual activity: Not on file  Other Topics Concern   Not on file  Social History Narrative   Usually drinks 1 cup of coffee a day    Retired, lives in home with spouse   Social Determinants of Radio broadcast assistant Strain: Not on file  Food Insecurity: Not on file  Transportation Needs: Not on file  Physical Activity: Not on file  Stress: Not on file  Social Connections: Not on file    Her Allergies Are:  Allergies  Allergen Reactions   Penicillins Rash    Has patient had a PCN reaction causing immediate rash, facial/tongue/throat swelling, SOB or lightheadedness with hypotension: Yes Has patient had a PCN reaction causing severe rash involving mucus membranes or skin necrosis: No Has patient had a PCN reaction that required hospitalization No Has patient had a PCN reaction occurring within the last 10 years: No If all of the above answers are "NO", then may proceed with Cephalosporin use.   :   Her Current  Medications Are:  Outpatient Encounter Medications as of 03/20/2022  Medication Sig   Acetaminophen (TYLENOL PO) Take by mouth.   baclofen (LIORESAL) 10 MG tablet Take 10 mg by mouth 2 (two) times daily as needed.   CALCIUM PO Take by mouth.   cholecalciferol (VITAMIN D3) 25 MCG (1000 UNIT) tablet Take 1,000 Units by mouth daily.   gabapentin (NEURONTIN) 300 MG capsule Take 300 mg by mouth at bedtime.   Lysine 1000 MG TABS Take by mouth.   Multiple Vitamin (MULTIVITAMIN PO) Take by mouth.   Sodium Sulfate-Mag Sulfate-KCl (SUTAB) [phone] MG TABS Take as directed based on the instructions you received in the office   No facility-administered encounter medications on file as of 03/20/2022.  :   Review of Systems:  Out of a complete 14 point review of systems, all are reviewed and negative with the exception of these symptoms as listed below:   Review of Systems  Neurological:        Pt here for Neuropathy pain  Pt states numbness and tingling in left thigh Pt states her gait is off . No falls in the last month     Objective:  Neurological Exam  Physical Exam Physical Examination:   Vitals:   03/20/22 0757  BP: 132/71  Pulse: 78    General Examination: The patient is a very pleasant 67 y.o. female in no acute distress. She appears well-developed and well-nourished and well groomed.   HEENT: Normocephalic, atraumatic, pupils are equal, round and reactive to light, extraocular tracking is good without limitation to gaze excursion or nystagmus noted. Hearing is grossly intact. Face is symmetric with normal facial animation and normal facial sensation to light touch, temperature, pinprick and vibration. Speech is clear with no dysarthria noted. There is no hypophonia. There is no lip, neck/head, jaw or voice tremor. Neck is supple with full range of passive and active motion. There are no carotid bruits on auscultation.  No abnormal involuntary mouth or face movements.   Chest:  Clear to auscultation without wheezing, rhonchi or crackles noted.  Heart: S1+S2+0, regular and normal without murmurs, rubs or gallops noted.   Abdomen: Soft, non-tender and non-distended.  Extremities: There is no pitting edema in the distal lower extremities bilaterally.   Skin: Warm and dry without trophic changes noted.   Musculoskeletal: exam reveals significant right hip pain with limitation  in range of motion.  Reports pain in the right groin, no pain on the left.  Neurologically:  Mental status: The patient is awake, alert and oriented in all 4 spheres.  All immediate and remote memory, attention, language skills and fund of knowledge are appropriate. There is no evidence of aphasia, agnosia, apraxia or anomia. Speech is clear with normal prosody and enunciation. Thought process is linear. Mood is normal and affect is normal.  Cranial nerves II - XII are as described above under HEENT exam.  Motor exam: Normal bulk, strength and tone is noted, with the exception of pain limitation with testing the right lower extremity.  There is no obvious action or resting tremor.  Romberg is negative. Fine motor skills and coordination: Normal finger taps, hand movements and rapid alternating patting in both upper extremities, normal foot taps in both lower extremities.  Reflexes are 2-3+ throughout including ankles, toes are downgoing bilaterally. Cerebellar testing: No dysmetria or intention tremor. There is no truncal or gait ataxia.  Normal finger-to-nose, normal heel-to-shin on the left, limited range of motion on the right lower extremity. Sensory exam: intact to light touch, temperature, vibration and pinprick sensation in both distal upper and lower extremities.  She does have sensitivity to light touch in the left lateral thigh area. Gait, station and balance: She stands with difficulty and slowly, reports right hip pain and right groin pain.  She walks with a significant limp, no walking  aid.  She is leaning on her long umbrella while walking towards checkout.  All tandem walk is not tested as she has trouble putting weight on the the right leg.  Assessment and Plan:  In summary, Courtney Wallace is a very pleasant 68 y.o.-year old female with an underlying medical history of prediabetes, reflux disease, hyperlipidemia, right hip pain, low back pain, and arthritis, who presents for evaluation of her paresthesias of the left lateral thigh area.  History and description of symptoms and examination findings are supportive of left-sided meralgia paresthetica.  I suspect that this is correlated with her right hip pain as she has favored her right leg and may have exacerbated/irritated the lateral femoral cutaneous nerve on the left.  Examination findings are not telltale for peripheral neuropathy, history not telltale for radiculopathy.  She had a lumbar spine MRI through [facility] in November 2023 which indicated mild degenerative changes, no herniated disc, no significant spinal stenosis.  She has seen orthopedics and also neurosurgery as well as a chiropractor recently.  I recommend that she go back to orthopedics and discussed the possibility of doing a right hip MRI.  She received a steroid shot some 2-1/2 months ago, she may be eligible for another shot later this month or early next month.  She is advised to start using a cane to relieve some of the pain on the right hip.  We talked about meralgia paresthetica at length and evaluation for this.  Unfortunately EMG nerve conduction velocity testing is difficult for this particular small cutaneous nerve.  Nevertheless, I recommend EMG nerve conduction velocity testing through our office for the left lower extremity to rule out any obvious cause, obvious peripheral neuropathy or radiculopathy signs.  We will do some blood work to look for inflammatory markers, vitamin deficiencies, CK level, and protein electrophoresis.  If all goes well we will call  her with her EMG results as well as blood test results and follow-up in this clinic as needed.  I do not believe she  needs another scan from my end of things.  She can continue with gabapentin at night for symptomatic relief through your office.  My hope is that if her right hip condition and pain and mobility improves, that her left thigh symptoms also improve over time.  Of note, she recently worked on weight loss and has lost about 10 pounds.  I answered all the questions today and the patient and her husband were in agreement.   Thank you very much for allowing me to participate in the care of this nice patient. If I can be of any further assistance to you please do not hesitate to call me at [phone].  Sincerely,   Star Age, MD, PhD

## 2022-03-20 NOTE — Patient Instructions (Signed)
You may have a pinched nerve condition that affects The outer left thigh area. This is a small nerve that sometimes gets pinched in the groin area.  While there is no specific treatment for this, we do consider medication, such as gabapentin to take the edge off the burning sensation and discomfort.  You can continue with low-dose gabapentin at bedtime through your primary care.  As discussed, we will proceed with an EMG and nerve conduction velocity test, which is an electrical nerve and muscle test, which we will schedule. We will call you with the results. You do have mild degenerative lower back disease. For your right hip pain I recommend that you follow-up with your orthopedic doctor.  My hope is that once your right hip symptoms and mobility improved, your left thigh symptoms also improve as a consequence.   We will also do blood work today and call you with the results.

## 2022-03-21 DIAGNOSIS — M5432 Sciatica, left side: Secondary | ICD-10-CM | POA: Diagnosis not present

## 2022-03-21 DIAGNOSIS — M9903 Segmental and somatic dysfunction of lumbar region: Secondary | ICD-10-CM | POA: Diagnosis not present

## 2022-03-21 DIAGNOSIS — M9902 Segmental and somatic dysfunction of thoracic region: Secondary | ICD-10-CM | POA: Diagnosis not present

## 2022-03-21 DIAGNOSIS — M25551 Pain in right hip: Secondary | ICD-10-CM | POA: Diagnosis not present

## 2022-03-21 DIAGNOSIS — M9905 Segmental and somatic dysfunction of pelvic region: Secondary | ICD-10-CM | POA: Diagnosis not present

## 2022-03-27 ENCOUNTER — Telehealth: Payer: Self-pay | Admitting: *Deleted

## 2022-03-27 NOTE — Telephone Encounter (Signed)
-----   Message from Star Age, MD sent at 03/27/2022 12:32 PM EST ----- A1c which is a diabetes marker in the prediabetes range, inflammatory marker called CRP mildly elevated, which is a nonspecific finding, could be due to arthritis, including her right hip pain.  Autoimmune marker called ANA was negative, rheumatoid factor also negative which is a marker for rheumatoid arthritis, muscle enzymes normal, no abnormality in protein breakdown of her blood, normal thyroid marker called TSH.  1 test is pending which is the vitamin B6, it takes longer to come back typically, we will update if abnormal.

## 2022-03-27 NOTE — Telephone Encounter (Signed)
Called pt and discussed her lab results as noted below by Dr Rexene Alberts. The patient verbalized understanding. Pt asked about Hgb A1c. I recommended she f/u with PCP about steps she can take to try to reduce risk of becoming diabetic. Patient stated she made an appt with ortho for next week to discuss possible MRI. She states if he will not order another then she is not sure where that leaves her. Patient has NCV on 4/4 with Dr Jaynee Eagles.

## 2022-03-27 NOTE — Telephone Encounter (Signed)
Noted, thanks for the update 

## 2022-03-31 DIAGNOSIS — M5432 Sciatica, left side: Secondary | ICD-10-CM | POA: Diagnosis not present

## 2022-03-31 DIAGNOSIS — M9903 Segmental and somatic dysfunction of lumbar region: Secondary | ICD-10-CM | POA: Diagnosis not present

## 2022-03-31 DIAGNOSIS — M9902 Segmental and somatic dysfunction of thoracic region: Secondary | ICD-10-CM | POA: Diagnosis not present

## 2022-03-31 DIAGNOSIS — M25551 Pain in right hip: Secondary | ICD-10-CM | POA: Diagnosis not present

## 2022-03-31 DIAGNOSIS — M9905 Segmental and somatic dysfunction of pelvic region: Secondary | ICD-10-CM | POA: Diagnosis not present

## 2022-04-03 LAB — CK: Total CK: 37 U/L (ref 32–182)

## 2022-04-03 LAB — MULTIPLE MYELOMA PANEL, SERUM
Albumin SerPl Elph-Mcnc: 4.1 g/dL (ref 2.9–4.4)
Albumin/Glob SerPl: 1.4 (ref 0.7–1.7)
Alpha 1: 0.2 g/dL (ref 0.0–0.4)
Alpha2 Glob SerPl Elph-Mcnc: 0.8 g/dL (ref 0.4–1.0)
B-Globulin SerPl Elph-Mcnc: 1.2 g/dL (ref 0.7–1.3)
Gamma Glob SerPl Elph-Mcnc: 1 g/dL (ref 0.4–1.8)
Globulin, Total: 3.1 g/dL (ref 2.2–3.9)
IgA/Immunoglobulin A, Serum: 202 mg/dL (ref 87–352)
IgG (Immunoglobin G), Serum: 984 mg/dL (ref 586–1602)
IgM (Immunoglobulin M), Srm: 67 mg/dL (ref 26–217)
Total Protein: 7.2 g/dL (ref 6.0–8.5)

## 2022-04-03 LAB — RHEUMATOID FACTOR: Rheumatoid fact SerPl-aCnc: <10 IU/mL (ref <14.0)

## 2022-04-03 LAB — SEDIMENTATION RATE: Sed Rate: 17 mm/hr (ref 0–40)

## 2022-04-03 LAB — ANA W/REFLEX: Anti Nuclear Antibody (ANA): NEGATIVE

## 2022-04-03 LAB — TSH: TSH: 0.991 u[IU]/mL (ref 0.450–4.500)

## 2022-04-03 LAB — HGB A1C W/O EAG: Hgb A1c MFr Bld: 5.7 % — ABNORMAL HIGH (ref 4.8–5.6)

## 2022-04-03 LAB — C-REACTIVE PROTEIN: CRP: 15 mg/L — ABNORMAL HIGH (ref 0–10)

## 2022-04-03 LAB — VITAMIN B6: Vitamin B6: 25.8 ug/L (ref 3.4–65.2)

## 2022-04-06 DIAGNOSIS — M7061 Trochanteric bursitis, right hip: Secondary | ICD-10-CM | POA: Diagnosis not present

## 2022-04-06 DIAGNOSIS — M1611 Unilateral primary osteoarthritis, right hip: Secondary | ICD-10-CM | POA: Diagnosis not present

## 2022-04-06 DIAGNOSIS — M25551 Pain in right hip: Secondary | ICD-10-CM | POA: Diagnosis not present

## 2022-04-07 DIAGNOSIS — M9905 Segmental and somatic dysfunction of pelvic region: Secondary | ICD-10-CM | POA: Diagnosis not present

## 2022-04-07 DIAGNOSIS — M9903 Segmental and somatic dysfunction of lumbar region: Secondary | ICD-10-CM | POA: Diagnosis not present

## 2022-04-07 DIAGNOSIS — M9902 Segmental and somatic dysfunction of thoracic region: Secondary | ICD-10-CM | POA: Diagnosis not present

## 2022-04-07 DIAGNOSIS — M25551 Pain in right hip: Secondary | ICD-10-CM | POA: Diagnosis not present

## 2022-04-07 DIAGNOSIS — M5432 Sciatica, left side: Secondary | ICD-10-CM | POA: Diagnosis not present

## 2022-04-21 DIAGNOSIS — M25551 Pain in right hip: Secondary | ICD-10-CM | POA: Diagnosis not present

## 2022-04-25 DIAGNOSIS — R5383 Other fatigue: Secondary | ICD-10-CM | POA: Diagnosis not present

## 2022-04-25 DIAGNOSIS — E7849 Other hyperlipidemia: Secondary | ICD-10-CM | POA: Diagnosis not present

## 2022-04-25 DIAGNOSIS — R748 Abnormal levels of other serum enzymes: Secondary | ICD-10-CM | POA: Diagnosis not present

## 2022-04-25 DIAGNOSIS — R7303 Prediabetes: Secondary | ICD-10-CM | POA: Diagnosis not present

## 2022-05-02 ENCOUNTER — Other Ambulatory Visit (HOSPITAL_COMMUNITY): Payer: Self-pay | Admitting: Internal Medicine

## 2022-05-02 DIAGNOSIS — R5383 Other fatigue: Secondary | ICD-10-CM | POA: Diagnosis not present

## 2022-05-02 DIAGNOSIS — M25551 Pain in right hip: Secondary | ICD-10-CM

## 2022-05-02 DIAGNOSIS — R030 Elevated blood-pressure reading, without diagnosis of hypertension: Secondary | ICD-10-CM | POA: Diagnosis not present

## 2022-05-02 DIAGNOSIS — R7303 Prediabetes: Secondary | ICD-10-CM | POA: Diagnosis not present

## 2022-05-02 DIAGNOSIS — M25559 Pain in unspecified hip: Secondary | ICD-10-CM | POA: Diagnosis not present

## 2022-05-02 DIAGNOSIS — M549 Dorsalgia, unspecified: Secondary | ICD-10-CM | POA: Diagnosis not present

## 2022-05-02 DIAGNOSIS — R200 Anesthesia of skin: Secondary | ICD-10-CM | POA: Diagnosis not present

## 2022-05-02 DIAGNOSIS — Z6823 Body mass index (BMI) 23.0-23.9, adult: Secondary | ICD-10-CM | POA: Diagnosis not present

## 2022-05-09 NOTE — Progress Notes (Unsigned)
GI Office Note    Referring Provider: [place] Nation, MD Primary Care Physician:  Tehama Nation, MD  Primary Gastroenterologist: Cristopher Estimable.Rourk, MD  Chief Complaint   Chief Complaint  Patient presents with   New Patient (Initial Visit)    Pt referred for fatty liver   History of Present Illness   Courtney Wallace is a 67 y.o. female presenting today at the request of Nicasio Nation, MD for fatty liver.   Colonoscopy by Dr. Arnoldo Morale in July 2012: No report available.   Colonoscopy 02/07/22 by Dr. Okey Dupre: -109mm polyp in the proximal transverse colon -pathology with sessile serrated polyp without dysplasia -recommended repeat colonoscopy in 3 years.   Family history of cirrhosis in her Father.   Labs 04/25/2022: CMP completely normal other than mildly elevated CO2.  A1c 5.9.  Cholesterol 281, triglycerides 177, LDL 139.   No abdominal imaging on file.  DEXA scan in December.   Today: Reports she had an ultrasound of her abdomen in November 2023. She reports up until November she was not tacking any medication on a regular basis. Had x-rays of her hips in November that revealed arthritis and then seen ortho and said she had bursitis and received a shot. She then had Physical therapy and that did not work. Seen chiropractor as well. Had MRI in November but was done of her back but not of her groin. She is due to have another MRI of her leg and that is on 4/22. She reports neurology also said they wanted her to have an MRI of her hip area. Also having tingling/numbness in her left keg that she is seeing [facility] for tomorrow.   Denies any nausea, vomiting, overt dysphagia, GERD, lack of appetite, early satiety, abdominal pain, constipation, diarrhea, changes in bowel habit, unintentional weight loss.  Rarely gets cramps in her throat and she is unable to talk or swallow. May have months in between symptoms. No triggers for this and lasts for 5 minutes or less.  Will massage and then it gets better on it on.   Patient reports that her father was diagnosed with cirrhosis but she was told that this was caused by his cholesterol medication.    Current Outpatient Medications  Medication Sig Dispense Refill   Acetaminophen (TYLENOL PO) Take by mouth.     CALCIUM PO Take by mouth.     cholecalciferol (VITAMIN D3) 25 MCG (1000 UNIT) tablet Take 1,000 Units by mouth daily.     gabapentin (NEURONTIN) 300 MG capsule Take 300 mg by mouth at bedtime.     Lysine 1000 MG TABS Take by mouth.     Multiple Vitamin (MULTIVITAMIN PO) Take by mouth.     baclofen (LIORESAL) 10 MG tablet Take 10 mg by mouth 2 (two) times daily as needed. (Patient not taking: Reported on 05/10/2022)     Sodium Sulfate-Mag Sulfate-KCl (SUTAB) [phone] MG TABS Take as directed based on the instructions you received in the office (Patient not taking: Reported on 05/10/2022) 24 tablet 0   No current facility-administered medications for this visit.    Past Medical History:  Diagnosis Date   Arm mass, left    Bursitis    right hip   Dyslipidemia    Fatty liver    GERD (gastroesophageal reflux disease)    Prediabetes    Right hip pain    bursitis    Past Surgical History:  Procedure Laterality Date   COLONOSCOPY  08/30/2010  Procedure: COLONOSCOPY;  Surgeon: Jamesetta So;  Location: [facility];  Service: Gastroenterology;  Laterality: N/A;   COLONOSCOPY WITH PROPOFOL N/A 02/07/2022   Procedure: COLONOSCOPY WITH PROPOFOL;  Surgeon: Rusty Aus, DO;  Location: [facility];  Service: General;  Laterality: N/A;   DILATION AND CURETTAGE OF UTERUS  yrs ago   MASS EXCISION Left 10/02/2019   Procedure: EXCISION LEFT ARM SUBCUTANEOUS MASS;  Surgeon: Kinsinger, Arta Bruce, MD;  Location: [facility];  Service: General;  Laterality: Left;   POLYPECTOMY  02/07/2022   Procedure: POLYPECTOMY;  Surgeon: Rusty Aus, DO;  Location: [facility];   Service: General;;   TONSILLECTOMY  age 50    Family History  Problem Relation Age of Onset   Dementia Mother    Melanoma Mother    Cirrhosis Father    Breast cancer Sister    Neuropathy Neg Hx     Allergies as of 05/10/2022 - Review Complete 05/10/2022  Allergen Reaction Noted   Penicillins Rash 08/30/2010    Social History   Socioeconomic History   Marital status: Married    Spouse name: Not on file   Number of children: Not on file   Years of education: Not on file   Highest education level: Not on file  Occupational History   Not on file  Tobacco Use   Smoking status: Never   Smokeless tobacco: Never  Vaping Use   Vaping Use: Never used  Substance and Sexual Activity   Alcohol use: No   Drug use: No   Sexual activity: Not on file  Other Topics Concern   Not on file  Social History Narrative   Usually drinks 1 cup of coffee a day    Retired, lives in home with spouse   Social Determinants of Health   Financial Resource Strain: Not on file  Food Insecurity: Not on file  Transportation Needs: Not on file  Physical Activity: Not on file  Stress: Not on file  Social Connections: Not on file  Intimate Partner Violence: Not on file     Review of Systems   Gen: Denies any fever, chills, fatigue, weight loss, lack of appetite.  CV: Denies chest pain, heart palpitations, peripheral edema, syncope.  Resp: Denies shortness of breath at rest or with exertion. Denies wheezing or cough.  GI: see HPI GU : Denies urinary burning, urinary frequency, urinary hesitancy MS: + Right hip pain and limited movement. denies  muscle weakness, cramps.  Derm: Denies rash, itching, dry skin Psych: Denies depression, anxiety, memory loss, and confusion Heme: Denies bruising, bleeding, and enlarged lymph nodes.   Physical Exam   BP 108/71   Pulse 76   Temp 98.4 F (36.9 C)   Ht 5\' 1"  (1.549 m)   Wt 140 lb 12.8 oz (63.9 kg)   LMP 02/06/2013   BMI 26.60 kg/m    General:   Alert and oriented. Pleasant and cooperative. Well-nourished and well-developed.  Head:  Normocephalic and atraumatic. Eyes:  Without icterus, sclera clear and conjunctiva pink.  Ears:  Normal auditory acuity. Mouth:  No deformity or lesions, oral mucosa pink.  Lungs:  Clear to auscultation bilaterally. No wheezes, rales, or rhonchi. No distress.  Heart:  S1, S2 present without murmurs appreciated.  Abdomen:  +BS, soft, non-tender and non-distended. No HSM noted. No guarding or rebound. No masses appreciated.  Rectal:  Deferred  Msk: Gait with a limp. Extremities:  Without edema. Neurologic:  Alert and  oriented x4;  grossly normal neurologically. Skin:  Intact without significant lesions or rashes. Psych:  Alert and cooperative. Normal mood and affect.   Assessment   ALICIYA SURRATT is a 67 y.o. female with a history of dyslipidemia, GERD, prediabetes, and right hip pain presenting today for evaluation of fatty liver.   Fatty liver: Family history of cirrhosis in her father.  Patient reports he developed cirrhosis related to his cholesterol medication.  Hyperlipidemia does run in her family.  Her most recent A1c was 5.9.  We discussed diet recommendations for fatty liver including following a low-fat/low-cholesterol diet and avoiding processed carbs and higher sugar content foods and beverages.  Her mobility is slightly limited right now given right hip pain.  Recommended upper body exercises to stay active.  Does not smoke or drink alcohol.  Most recent labs with elevated cholesterol.  She will focus on diet and lifestyle modifications given fear of starting anticholesterol medication.  She reports she has had an ultrasound done but I do not have the results.  Will request these from her PCP.  Pending results may repeat ultrasound or obtain additional blood work.  Reassuring her liver enzymes are completely normal right now and she has no right upper quadrant pain or any upper or  lower GI symptoms.   PLAN    Request [place] results from [address] 1-2# weight loss per week until ideal body weight through exercise & diet. Low fat/cholesterol diet.  Avoid sweets, sodas, fruit juices, sweetened beverages like tea, etc. Gradually increase exercise from 15 min daily up to 1 hr per day 5 days/week. Limit alcohol use. Follow up to be determine after receiving [place] results.    Venetia Night, MSN, FNP-BC, AGACNP-BC [facility]

## 2022-05-10 ENCOUNTER — Encounter: Payer: Self-pay | Admitting: Gastroenterology

## 2022-05-10 ENCOUNTER — Ambulatory Visit: Payer: Medicare Other | Admitting: Gastroenterology

## 2022-05-10 VITALS — BP 108/71 | HR 76 | Temp 98.4°F | Ht 61.0 in | Wt 140.8 lb

## 2022-05-10 DIAGNOSIS — K760 Fatty (change of) liver, not elsewhere classified: Secondary | ICD-10-CM | POA: Diagnosis not present

## 2022-05-10 NOTE — Patient Instructions (Signed)
I have attached a handout regarding fatty liver for you to review as well as diet recommendations.  Exercise as tolerated.  I would stick with more upper body training right now given your lower extremity pain and immobility.  I will ask for ultrasound results from Dr. Jimmye Norman office and after I have reviewed them I will reach out to you with any further recommendations or if any additional blood work is needed.  Given you are not on any medication right now for your cholesterol I would favor just checking your liver enzymes every 6 months.  It was a pleasure to see you today. I want to create trusting relationships with patients. If you receive a survey regarding your visit,  I greatly appreciate you taking time to fill this out on paper or through your MyChart. I value your feedback.  Venetia Night, MSN, FNP-BC, AGACNP-BC [facility]

## 2022-05-11 ENCOUNTER — Encounter: Payer: Self-pay | Admitting: Orthopaedic Surgery

## 2022-05-11 ENCOUNTER — Ambulatory Visit: Payer: Medicare Other | Admitting: Neurology

## 2022-05-11 ENCOUNTER — Ambulatory Visit (INDEPENDENT_AMBULATORY_CARE_PROVIDER_SITE_OTHER): Payer: Medicare Other | Admitting: Orthopaedic Surgery

## 2022-05-11 VITALS — Ht 61.0 in | Wt 138.0 lb

## 2022-05-11 DIAGNOSIS — G5712 Meralgia paresthetica, left lower limb: Secondary | ICD-10-CM | POA: Diagnosis not present

## 2022-05-11 DIAGNOSIS — M1611 Unilateral primary osteoarthritis, right hip: Secondary | ICD-10-CM

## 2022-05-11 DIAGNOSIS — M25551 Pain in right hip: Secondary | ICD-10-CM

## 2022-05-11 NOTE — Progress Notes (Signed)
EMG/NCS was consistent with left-side meralgia paresthetica. Discussed with patient, explained with diagrams online. [facility] order. Discussed nerve blocks but I am unaware of any in [place] we can sent to Dr. Ethelene Halamos here at [facility] or she can try and find injectors in [place] will relay to Dr. Frances FurbishAthar referring physician.   Orders Placed This Encounter  Procedures   Ambulatory referral to Physical Therapy   Reviewed MRI lumbar spine as below: CLINICAL DATA: Low back pain with bilateral hip pain.  EXAM: MRI LUMBAR SPINE WITHOUT CONTRAST  TECHNIQUE: Multiplanar, multisequence MR imaging of the lumbar spine was performed. No intravenous contrast was administered.  COMPARISON: CT lumbar spine July 30, 2014.  FINDINGS: Segmentation: Standard.  Alignment: Physiologic.  Vertebrae: No fracture, evidence of discitis, or bone lesion. Endplate degenerative changes at L3-4.  Conus medullaris and cauda equina: Conus extends to the L1-2 level. Conus and cauda equina appear normal.  Paraspinal and other soft tissues: Negative.  Disc levels:  T12-L1: No spinal or neural foraminal stenosis.  L1-2: Tiny right central disc protrusion causing small indentation the thecal sac without significant spinal canal or neural foraminal stenosis.  L2-3: Minimal disc bulge and mild facet degenerative changes without significant spinal canal or neural foraminal stenosis.  L3-4: Loss of disc height, disc bulge, mild facet degenerative changes and ligamentum flavum resulting in minimal spinal canal stenosis and minimal bilateral neural foraminal narrowing.  L4-5: Disc bulge, mild-to-moderate facet degenerative changes ligamentum flavum redundancy resulting in mild spinal canal stenosis with mild narrowing of the bilateral subarticular zones and mild bilateral neural foraminal narrowing.  L5-S1: Shallow disc bulge and moderate hypertrophic facet degenerative  changes, right greater than left. No significant spinal canal or neural foraminal stenosis.  IMPRESSION: Mild degenerative changes of the lumbar spine, more pronounced at L4-5 where there is mild spinal canal stenosis with mild narrowing of the bilateral subarticular zones and mild bilateral neural foraminal narrowing.  12/16/2021 ordered.   I spent 30 minutes of face-to-face and non-face-to-face time with patient on the  1. Meralgia paresthetica, left    diagnosis.  This included previsit chart review, lab review, study review, order entry, electronic health record documentation, patient education on the different diagnostic and therapeutic options, counseling and coordination of care, risks and benefits of management, compliance, or risk factor reduction. This does not include time spent on emg/ncs

## 2022-05-11 NOTE — Patient Instructions (Addendum)
Left leg numbness in thigh:  Numbness, severe pain, bee stings, anterio-lateral thigh. No left sided back pain or radicular symptoms. She works on a farm. Happened in November. She has right hip pain as well. Today here for left thigh numbness, stable. Continuous numbness. She has hurt her back in the past lifting too much but no symptoms now.   MERALGIA PARESTHETICA   Meralgia Paresthetica Meralgia paresthetica causes pain and sensations of burning or numbness in your thigh area due to compression of a nerve. Several conditions and situations can cause it, such as wearing tight clothing, pregnancy and direct injury to your nerve. Meralgia paresthetica is treatable.  Contents Overview Symptoms and Causes Diagnosis and Tests Management and Treatment Prevention Outlook / Prognosis Living With Contents Overview Symptoms and Causes Diagnosis and Tests Management and Treatment Prevention Outlook / Prognosis Living With Overview Inflammation of the lateral femoral cutaneous nerve, which starts in your spine and extends down your pelvis and thigh. Meralgia paresthetica is a medical condition that causes pain and sensations of aching, burning or numbness in your thigh area. It results from compression of your lateral femoral cutaneous nerve (LFCN). What is meralgia paresthetica? Meralgia paresthetica is a medical condition that causes pain and sensations of aching, burning, numbness or stabbing in your thigh area. The condition results from compression (pressure on or squeezing) of your lateral femoral cutaneous nerve (LFCN). This large nerve supplies sensation to the front and side of your thigh.  "Meralgia" means "pain in the thigh," and "paresthetica" means "burning pain, tingling or itch."  While the condition can be bothersome, it's not life-threatening or dangerous to your health.  Who does meralgia paresthetica affect? Anyone can develop meralgia paresthetica, but you're more likely  to develop this condition if you:  Have diabetes, hypothyroidism and/or alcohol use disorder. Have overweight or obesity. Have lead poisoning. Are injured by your seatbelt during a car accident. Are pregnant. Had recent surgery around your hip area. Wear tight clothing, girdles or stockings or wear a heavy utility belt (like a tool belt). Have legs of two different lengths. Have scoliosis. How common is meralgia paresthetica? Meralgia paresthetica is relatively common, but it's frequently misdiagnosed. Researchers estimate that it affects 3 to 4 people out of every 10,000 per year.  Symptoms and Causes What are the symptoms of meralgia paresthetica? Symptoms of meralgia paresthetica only occur on one side of your body in the front of your upper thigh. They include:  Pain, which may extend down to the outer side of your knee. Burning, aching, tingling or numbness in your thigh. Increased pain sensitivity (for example, gently touching your thigh may cause pain). Worse pain after walking or standing for long periods. Meralgia paresthetica doesn't directly cause issues with your muscles or movement.  What causes meralgia paresthetica? Meralgia paresthetica results from the compression of your lateral femoral cutaneous nerve (LFCN). Your LFCN is a large sensory nerve. It travels from your spinal cord through your pelvic region and down the outside of your thigh. This compression can happen due to swelling and inflammation, injury or pressure.  A variety of factors causes compression of your LFCN, including external and internal causes. These can include:  Injury or surgery in your hip area. Medical conditions like obesity, pregnancy and diabetes. Wearing clothing that's too tight or belts around your waist. A tumor near your LFCN. Diagnosis and Tests How is meralgia paresthetica diagnosed? Healthcare providers can typically diagnose meralgia paresthetica with a physical exam and a  thorough understanding  of your symptoms, medical history and lifestyle.  Your provider will review your medical and surgical history. As several situations and conditions can cause meralgia paresthetica, they'll ask many questions to try to determine the possible cause of your symptoms.  They'll perform a thorough physical exam, including a hands-on test called a pelvic compression test. During this test, your provider will apply pressure on your thigh to rule out other causes of your symptoms. They may perform other light touch and reflex tests.  If they can't determine the cause of meralgia paresthetica, they'll likely order certain medical tests.  What tests will be done to diagnose meralgia paresthetica? If your healthcare provider can't determine the cause of meralgia paresthetica based on your medical and lifestyle history, they may order blood tests to check the following:  Thyroid hormone levels for signs of hypothyroidism. B vitamin levels for signs of vitamin B12 deficiency or folate deficiency, which can affect nerve function. Lead levels for signs of lead poisoning. Blood glucose levels for signs of diabetes. Hemoglobin test or hematocrit test to look for evidence of anemia. They may order an X-ray of your pelvis and thigh to rule out other medical conditions, like bone tumors. Other imaging tests, such as a CT scan or magnetic resonance imaging (MRI) scan can check for other spinal or nerve issues, like a herniated disc.  Management and Treatment How do you treat meralgia paresthetica? Treating meralgia paresthetica involves treating the underlying cause.  The majority of cases improve with conservative treatment, such as losing weight, wearing loose clothing or avoiding certain restrictive items like belts.  Many people with meralgia paresthetica benefit from other interventions, including:  Temporary symptom relief: Icing the area may help reduce nerve irritation and  symptoms. Nonsteroidal anti-inflammatory medications (NSAIDs) and topical medications such as capsaicin and lidocaine can also help symptoms. Radiofrequency nerve ablation: This treatment uses radio waves to create a current that heats a small area of nerve tissue. The heat destroys that area of the nerve, stopping it from sending pain signals to your brain. Medications: Medications like gabapentin, pregabalin, duloxetine, phenytoin or carbamazepine may help treat neuropathic pain. Nerve blocks: A nerve block is the injection of a local anesthetic close to a targeted nerve or group of nerves to lessen pain. Physical therapy may help, but there's limited research on its effectiveness in treating meralgia paresthetica.  Rarely, surgery is necessary to correct compression on the lateral femoral cutaneous nerve. Healthcare providers usually only recommend surgery for people who try other treatments but still experience symptoms.  Does meralgia paresthetica go away? Most cases of meralgia paresthetica improve with conservative treatment or may even resolve on their own.  Prevention Can I prevent meralgia paresthetica? There's no way to prevent meralgia paresthetica. You can reduce your likelihood of developing it by:  Maintaining a weight that's healthy for you. Wearing loose clothing. Avoiding girdles or belts, including tool belts. Outlook / Prognosis What is the prognosis for meralgia paresthetica? The prognosis (outlook) for meralgia paresthetica is usually good. Approximately 85% of people with meralgia paresthetica experience recovery with conservative treatment.  Cases due to surgical intervention or direct nerve injury typically improve within three months. Pregnancy-associated cases typically improve after the delivery of your baby.  What happens if meralgia paresthetica goes untreated? Left untreated, meralgia paresthetica may cause increased pain, numbness or other sensations like  burning. These effects may interfere with the ability to walk or move as you normally do.  Living With When should I see my  healthcare provider? If you've been diagnosed with meralgia paresthetica and your treatment isn't working or you're experiencing negative side effects, talk to a healthcare provider about other options.  A note from [facility]  Meralgia paresthetica is a medical condition that causes pain and sensations of aching, burning, numbness or stabbing in your thigh area. While meralgia paresthetica isn't a danger to your health, it can cause unpleasant and uncomfortable symptoms. If the condition is interfering with your quality of life, talk to a healthcare provider. They can determine the underlying cause and recommend treatments.        Cervical Radiculopathy        Cervical Radiculopathy  Cervical radiculopathy happens when a nerve in the neck (a cervical nerve) is pinched or bruised. This condition can happen because of an injury to the cervical spine (vertebrae) in the neck, or as part of the normal aging process. Pressure on the cervical nerves can cause pain or numbness that travels from the neck all the way down to the arm and fingers. This condition usually gets better with rest. Treatment may be needed if the condition does not improve. What are the causes? This condition may be caused by: A neck injury. A bulging (herniated) disk. Muscle spasms. Muscle tightness in the neck due to overuse. Arthritis. Breakdown or degeneration in the bones and joints of the spine (spondylosis) due to aging. Bone spurs that may develop near the cervical nerves. What are the signs or symptoms? Symptoms of this condition include: Pain. The pain may travel from the neck to the arm and hand. The pain can be severe or irritating. It may get worse when you move your neck. Numbness or tingling in your arm or hand. Weakness in the affected arm and hand, in severe  cases. How is this diagnosed? This condition may be diagnosed based on your symptoms, your medical history, and a physical exam. You may also have tests, including: X-rays. CT scan. MRI. Electromyogram (EMG). Nerve conduction tests. How is this treated? In many cases, treatment is not needed for this condition. With rest, the condition usually gets better over time. If treatment is needed, options may include: Wearing a soft neck collar (cervical collar) for short periods of time. Doing physical therapy to strengthen your neck muscles. Taking medicines. These may include NSAIDs, such as ibuprofen, or oral corticosteroids. Having spinal injections, in severe cases. Having surgery. This may be needed if other treatments do not help. Different types of surgery may be done depending on the cause of this condition. Follow these instructions at home: If you have a cervical collar: Wear it as told by your health care provider. Remove it only as told by your health care provider. Ask your health care provider if you can remove the cervical collar for cleaning and bathing. If you are allowed to remove the collar for cleaning or bathing: Follow instructions from your health care provider about how to remove the collar safely. Clean the collar by wiping it with mild soap and water and drying it completely. Take out any removable pads in the collar every 1-2 days, and wash them by hand with soap and water. Let them air-dry completely before you put them back in the collar. Check your skin under the collar for irritation or sores. If you see any, tell your health care provider. Managing pain     Take over-the-counter and prescription medicines only as told by your health care provider. If directed, put ice on the affected  area. To do this: If you have a soft neck collar, remove it as told by your health care provider. Put ice in a plastic bag. Place a towel between your skin and the bag. Leave the  ice on for 20 minutes, 2-3 times a day. Remove the ice if your skin turns bright red. This is very important. If you cannot feel pain, heat, or cold, you have a greater risk of damage to the area. If applying ice does not help, you can try using heat. Use the heat source that your health care provider recommends, such as a moist heat pack or a heating pad. Place a towel between your skin and the heat source. Leave the heat on for 20-30 minutes. Remove the heat if your skin turns bright red. This is especially important if you are unable to feel pain, heat, or cold. You have a greater risk of getting burned. Try a gentle neck and shoulder massage to help relieve symptoms. Activity Rest as needed. Return to your normal activities as told by your health care provider. Ask your health care provider what activities are safe for you. Do stretching and strengthening exercises as told by your health care provider or your physical therapist. You may have to avoid lifting. Ask your health care provider how much you can safely lift. General instructions Use a flat pillow when you sleep. Do not drive while wearing a cervical collar. If you do not have a cervical collar, ask your health care provider if it is safe to drive while your neck heals. Ask your health care provider if the medicine prescribed to you requires you to avoid driving or using machinery. Do not use any products that contain nicotine or tobacco. These products include cigarettes, chewing tobacco, and vaping devices, such as e-cigarettes. If you need help quitting, ask your health care provider. Keep all follow-up visits. This is important. Contact a health care provider if: Your condition does not improve with treatment. Get help right away if: Your pain gets much worse and is not controlled with medicines. You have weakness or numbness in your hand, arm, face, or leg. You have a high fever. You have a stiff, rigid neck. You lose  control of your bowels or your bladder (have incontinence). You have trouble with walking, balance, or speaking. Summary Cervical radiculopathy happens when a nerve in the neck is pinched or bruised. A nerve can get pinched from a bulging disk, arthritis, muscle spasms, or an injury to the neck. Symptoms include pain, tingling, or numbness radiating from the neck to the arm or hand. Weakness can also occur in severe cases. Treatment may include rest, wearing a cervical collar, and physical therapy. Medicines may be prescribed to help with pain. In severe cases, injections or surgery may be needed. This information is not intended to replace advice given to you by your health care provider. Make sure you discuss any questions you have with your health care provider. Document Revised: 07/29/2020 Document Reviewed: 07/29/2020 Elsevier Patient Education  2023 [employer].

## 2022-05-12 DIAGNOSIS — M1611 Unilateral primary osteoarthritis, right hip: Secondary | ICD-10-CM | POA: Insufficient documentation

## 2022-05-12 NOTE — Progress Notes (Signed)
Office Visit Note   Patient: Courtney Wallace           Date of Birth: 08/22/1955           MRN: [id number] Visit Date: 05/11/2022              Requested by: Donetta PottsWilliams, Gordon F, MD [address] [place],  [place] [id number] PCP: Donetta PottsWilliams, Gordon F, MD   Assessment & Plan: Visit Diagnoses:  1. Unilateral primary osteoarthritis, right hip     Plan: Patient has MRI pending.  MRI scan 11/24 reviewed with her.  She recently had some numbness in her anterior left thigh and saw neurology for this.  She does not have any weakness in her leg but neurologist felt it was related to some disc bulge.  Centrally she only had mild stenosis at L4-5.  She can return after MRI if she so desires to review this.  Discussed patient her exam findings and x-rays are consistent with right hip osteoarthritis.  Follow-Up Instructions: No follow-ups on file.   Orders:  No orders of the defined types were placed in this encounter.  No orders of the defined types were placed in this encounter.     Procedures: No procedures performed   Clinical Data: No additional findings.   Subjective: Chief Complaint  Patient presents with   Right Hip - Pain    HPI 67 year old female here for second opinion concerning right hip pain and limping.  She has an MRI scheduled for 6.2/24 and x-rays show loss of joint space right hip.  She has had a trochanteric injection with minimal relief.  Intra-articular injection seen by Dr. Emilia BeckStetler with with temporary improvement nerve conduction velocities pending 05/11/2022.  Pains in her right groin that radiates down to her knee causes her to limp she is taken gabapentin and does not want to take any narcotics.  She states she had been able to do some jogging in the past but now cannot even walk.  She had carpal tunnel release when she tries to use a cane aggravates carpal tunnel she does have a walker at home. Previous benign lesion transverse colon.  Otherwise she has been active and  healthy. Review of Systems All systems noncontributory to HPI.  Objective: Vital Signs: Ht 5\' 1"  (1.549 m)   Wt 138 lb (62.6 kg)   LMP 02/06/2013   BMI 26.07 kg/m   Physical Exam Constitutional:      Appearance: She is well-developed.  HENT:     Head: Normocephalic.     Right Ear: External ear normal.     Left Ear: External ear normal. There is no impacted cerumen.  Eyes:     Pupils: Pupils are equal, round, and reactive to light.  Neck:     Thyroid: No thyromegaly.     Trachea: No tracheal deviation.  Cardiovascular:     Rate and Rhythm: Normal rate.  Pulmonary:     Effort: Pulmonary effort is normal.  Abdominal:     Palpations: Abdomen is soft.  Musculoskeletal:     Cervical back: No rigidity.  Skin:    General: Skin is warm and dry.  Neurological:     Mental Status: She is alert and oriented to person, place, and time.  Psychiatric:        Behavior: Behavior normal.     Ortho Exam patient has 20 degrees internal rotation right hip which reproduces her pain positive Trendelenburg gait no sciatic notch tenderness no tenderness  to lumbar spine pelvis is level no scoliosis.  Reflexes are 2+ and symmetrical full knee range of motion opposite left hip has internal and external rotation 30 to 40 degrees without pain.  Distal pulses intact.  No hip flexion contracture.  No hip flexion weakness no sensory deficit right lower extremity.  Specialty Comments:  No specialty comments available.  Imaging: No results found.   PMFS History: Patient Active Problem List   Diagnosis Date Noted   Unilateral primary osteoarthritis, right hip 05/12/2022   Screening for colon cancer 02/07/2022   Benign neoplasm of transverse colon 02/07/2022   Hemorrhoids without complication 02/07/2022   Past Medical History:  Diagnosis Date   Arm mass, left    Bursitis    right hip   Dyslipidemia    Fatty liver    GERD (gastroesophageal reflux disease)    Prediabetes    Right hip pain     bursitis    Family History  Problem Relation Age of Onset   Dementia Mother    Melanoma Mother    Cirrhosis Father    Breast cancer Sister    Neuropathy Neg Hx     Past Surgical History:  Procedure Laterality Date   COLONOSCOPY  08/30/2010   Procedure: COLONOSCOPY;  Surgeon: Dalia Heading;  Location: [facility];  Service: Gastroenterology;  Laterality: N/A;   COLONOSCOPY WITH PROPOFOL N/A 02/07/2022   Procedure: COLONOSCOPY WITH PROPOFOL;  Surgeon: Lewie Chamber, DO;  Location: [facility];  Service: General;  Laterality: N/A;   DILATION AND CURETTAGE OF UTERUS  yrs ago   MASS EXCISION Left 10/02/2019   Procedure: EXCISION LEFT ARM SUBCUTANEOUS MASS;  Surgeon: Kinsinger, De Blanch, MD;  Location: [facility];  Service: General;  Laterality: Left;   POLYPECTOMY  02/07/2022   Procedure: POLYPECTOMY;  Surgeon: Lewie Chamber, DO;  Location: [facility];  Service: General;;   TONSILLECTOMY  age 38   Social History   Occupational History   Not on file  Tobacco Use   Smoking status: Never   Smokeless tobacco: Never  Vaping Use   Vaping Use: Never used  Substance and Sexual Activity   Alcohol use: No   Drug use: No   Sexual activity: Not on file

## 2022-05-14 NOTE — Progress Notes (Unsigned)
Full Name: Martyna Quinter Gender: Female MRN #: [id number] Date of Birth: November 18, 1955    Visit Date: 05/11/2022 07:58 Age: 67 Years Examining Physician: Dr. Naomie Dean Referring Physician: Dr. Gareth Morgan Height: 5 feet 1 inch    Conclusion:     ------------------------------- Physician Name, M.D.  [facility] [address] [place], [place] [address] Tel: [phone] Fax: [phone]  Verbal informed consent was obtained from the patient, patient was informed of potential risk of procedure, including bruising, bleeding, hematoma formation, infection, muscle weakness, muscle pain, numbness, among others.        MNC    Nerve / Sites Muscle Latency Ref. Amplitude Ref. Rel Amp Segments Distance Velocity Ref. Area    ms ms mV mV %  cm m/s m/s mVms  L Peroneal - EDB     Ankle EDB 4.5 ?6.5 3.8 ?2.0 100 Ankle - EDB 9   14.1     Fib head EDB 9.3  3.0  79.1 Fib head - Ankle 23 48 ?44 12.3     Pop fossa EDB 11.3  3.6  117 Pop fossa - Fib head 10 50 ?44 15.2         Pop fossa - Ankle      R Peroneal - EDB     Ankle EDB 5.9 ?6.5 3.6 ?2.0 100 Ankle - EDB 9   10.3     Fib head EDB 10.6  3.5  97.7 Fib head - Ankle 24.6 51 ?44 11.6     Pop fossa EDB 13.0  2.8  79.1 Pop fossa - Fib head 11 47 ?44 8.3         Pop fossa - Ankle      L Tibial - AH     Ankle AH 5.4 ?5.8 11.0 ?4.0 100 Ankle - AH 9   22.5     Pop fossa AH 11.8  8.6  77.7 Pop fossa - Ankle 35 55 ?41 22.9  R Tibial - AH     Ankle AH 4.7 ?5.8 13.4 ?4.0 100 Ankle - AH 9   29.1     Pop fossa AH 12.8  9.7  72.4 Pop fossa - Ankle 36 45 ?41 24.6             SNC    Nerve / Sites Rec. Site Peak Lat Ref.  Amp Ref. Segments Distance    ms ms V V  cm  L Sural - Ankle (Calf)     Calf Ankle 3.1 ?4.4 20 ?6 Calf - Ankle 14  R Sural - Ankle (Calf)     Calf Ankle 3.2 ?4.4 22 ?6 Calf - Ankle 14  L Superficial peroneal - Ankle     Lat leg Ankle 3.5 ?4.4 10 ?6 Lat leg - Ankle 14  R Superficial peroneal -  Ankle     Lat leg Ankle 3.0 ?4.4 9 ?6 Lat leg - Ankle 14  R GENERIC NERVE     Site 1  2.8  11  Site 1 - G1   L GENERIC NERVE     Site 1  NR  NR  Site 1 - G1                  F  Wave    Nerve F Lat Ref.   ms ms  L Tibial - AH 50.5 ?56.0  R Tibial - AH 52.5 ?56.0  EMG Summary Table    Spontaneous MUAP Recruitment  Muscle IA Fib PSW Fasc Other Amp Dur. Poly Pattern  L. Iliopsoas Normal None None None _______ Normal Normal Normal Normal  L. Vastus medialis Normal None None None _______ Normal Normal Normal Normal  L. Tibialis anterior Normal None None None _______ Normal Normal Normal Normal  L. Gastrocnemius (Medial head) Normal None None None _______ Normal Normal Normal Normal  L. Extensor hallucis longus Normal None None None _______ Normal Normal Normal Normal  L. Biceps femoris (long head) Normal None None None _______ Normal Normal Normal Normal  L. Gluteus maximus Normal None None None _______ Normal Normal Normal Normal  L. Gluteus medius Normal None None None _______ Normal Normal Normal Normal  L. Lumbar paraspinals (low) Normal None None None _______ Normal Normal Normal Normal  R. Lumbar paraspinals (low) Normal None None None _______ Normal Normal Normal Normal

## 2022-05-15 ENCOUNTER — Telehealth: Payer: Self-pay | Admitting: Radiology

## 2022-05-15 DIAGNOSIS — M1611 Unilateral primary osteoarthritis, right hip: Secondary | ICD-10-CM

## 2022-05-15 NOTE — Telephone Encounter (Signed)
Please see message from [place] office below and advise.  She is ready to set up her hip surgery.  Her number is [phone].

## 2022-05-15 NOTE — Procedures (Signed)
Full Name: Courtney Wallace Gender: Female MRN #: [id number] Date of Birth: 06/21/1955    Visit Date: 05/11/2022 07:58 Age: 67 Years Examining Physician: Dr. Naomie Dean Referring Physician: Dr. Huston Foley Height: 5 feet 1 inch  History: Left leg numbness in thigh:  Numbness, severe pain, bee stings, anterio-lateral thigh. No left sided back pain or radicular symptoms. She works on a farm. Happened in November. She has right hip pain as well. Today here for left thigh numbness, stable. Continuous numbness. She has hurt her back in the past lifting too much but no symptoms now.   Summary: Testing was performed on the bilateral lower extremities. The left lateral femoral cutaneous nerve showed no response. The right lateral femoral cutaneous nerve was within normal limits. All remaining nerves (as indicated in the following tables) were within normal limits.  All muscles (as indicated in the following tables) were within normal limits.       Conclusion:  There is electrophysiologic evidence for left Meralgia Paresthetica. The right lateral Femoral cutaneous nerve was within normal limits. The left lateral femoral cutaneous nerve showed no response. Remaining nerve conductions and emg needle testing on both lower extremities and paraspinals were normal without evidence for polyradiculopathy or lumbar radiculopathy.   ------------------------------- Naomie Dean, M.D.  [facility] [address] [place], [place] [address] Tel: [phone] Fax: [phone]  Verbal informed consent was obtained from the patient, patient was informed of potential risk of procedure, including bruising, bleeding, hematoma formation, infection, muscle weakness, muscle pain, numbness, among others.        MNC    Nerve / Sites Muscle Latency Ref. Amplitude Ref. Rel Amp Segments Distance Velocity Ref. Area    ms ms mV mV %  cm m/s m/s mVms  L Peroneal - EDB     Ankle EDB 4.5 ?6.5  3.8 ?2.0 100 Ankle - EDB 9   14.1     Fib head EDB 9.3  3.0  79.1 Fib head - Ankle 23 48 ?44 12.3     Pop fossa EDB 11.3  3.6  117 Pop fossa - Fib head 10 50 ?44 15.2         Pop fossa - Ankle      R Peroneal - EDB     Ankle EDB 5.9 ?6.5 3.6 ?2.0 100 Ankle - EDB 9   10.3     Fib head EDB 10.6  3.5  97.7 Fib head - Ankle 24.6 51 ?44 11.6     Pop fossa EDB 13.0  2.8  79.1 Pop fossa - Fib head 11 47 ?44 8.3         Pop fossa - Ankle      L Tibial - AH     Ankle AH 5.4 ?5.8 11.0 ?4.0 100 Ankle - AH 9   22.5     Pop fossa AH 11.8  8.6  77.7 Pop fossa - Ankle 35 55 ?41 22.9  R Tibial - AH     Ankle AH 4.7 ?5.8 13.4 ?4.0 100 Ankle - AH 9   29.1     Pop fossa AH 12.8  9.7  72.4 Pop fossa - Ankle 36 45 ?41 24.6             SNC    Nerve / Sites Rec. Site Peak Lat Ref.  Amp Ref. Segments Distance    ms ms V V  cm  L Sural - Ankle (Calf)  Calf Ankle 3.1 ?4.4 20 ?6 Calf - Ankle 14  R Sural - Ankle (Calf)     Calf Ankle 3.2 ?4.4 22 ?6 Calf - Ankle 14  L Superficial peroneal - Ankle     Lat leg Ankle 3.5 ?4.4 10 ?6 Lat leg - Ankle 14  R Superficial peroneal - Ankle     Lat leg Ankle 3.0 ?4.4 9 ?6 Lat leg - Ankle 14  Right Lateral Femoral Cutaneous Nerve     Site 1  2.8  11  Site 1 - G1   Left Lateral Femoral Cutaneous Nerve     Site 1  NR  NR  Site 1 - G1                  F  Wave    Nerve F Lat Ref.   ms ms  L Tibial - AH 50.5 ?56.0  R Tibial - AH 52.5 ?56.0         EMG Summary Table    Spontaneous MUAP Recruitment  Muscle IA Fib PSW Fasc Other Amp Dur. Poly Pattern  L. Iliopsoas Normal None None None _______ Normal Normal Normal Normal  L. Vastus medialis Normal None None None _______ Normal Normal Normal Normal  L. Tibialis anterior Normal None None None _______ Normal Normal Normal Normal  L. Gastrocnemius (Medial head) Normal None None None _______ Normal Normal Normal Normal  L. Extensor hallucis longus Normal None None None _______ Normal Normal Normal Normal  L. Biceps  femoris (long head) Normal None None None _______ Normal Normal Normal Normal  L. Gluteus maximus Normal None None None _______ Normal Normal Normal Normal  L. Gluteus medius Normal None None None _______ Normal Normal Normal Normal  L. Lumbar paraspinals (low) Normal None None None _______ Normal Normal Normal Normal  R. Lumbar paraspinals (low) Normal None None None _______ Normal Normal Normal Normal

## 2022-05-15 NOTE — Telephone Encounter (Signed)
I called Central Scheduling at [phone]. [facility] does not have any earlier availability, but patient could be scheduled next week at [facility]. Unfortunately, they still have patient pending as insurance authorization has not been obtained. I called [facility] to see where they are on prior auth as PCP Mitzi Hansen, MD originally ordered scan. Spoke with Victory Dakin there who is going to have patient's nurse return my call.  If prior Berkley Harvey is obtained, can call central scheduling back and have patient go to [place] if she would like. Awaiting call from PCP.

## 2022-05-16 NOTE — Addendum Note (Signed)
Addended by: Rogers Seeds on: 05/16/2022 04:54 PM   Modules accepted: Orders

## 2022-05-16 NOTE — Telephone Encounter (Signed)
Still have not heard from PCP.  I spoke with Martie Lee who states [facility] Medicare does not require prior auth.  Since I am unable to change order to STAT due to not being ordering physician, new order for MRI right hip by Dr. Ophelia Charter entered and marked as STAT. Martie Lee to work on trying to get appt sooner at [facility] if available. If not, patient may have to go to different facility.  I left voicemail for patient advising of above.

## 2022-05-17 ENCOUNTER — Telehealth: Payer: Self-pay

## 2022-05-17 NOTE — Telephone Encounter (Signed)
Received voice mail from patient requesting return call.  I called patient back and left voice mail for her to return my call.

## 2022-05-19 ENCOUNTER — Ambulatory Visit (HOSPITAL_BASED_OUTPATIENT_CLINIC_OR_DEPARTMENT_OTHER)
Admission: RE | Admit: 2022-05-19 | Discharge: 2022-05-19 | Disposition: A | Discharge status: Home / Self Care | Payer: Medicare Other | Source: Ambulatory Visit | Attending: Internal Medicine | Admitting: Internal Medicine

## 2022-05-19 DIAGNOSIS — M25451 Effusion, right hip: Secondary | ICD-10-CM | POA: Diagnosis not present

## 2022-05-19 DIAGNOSIS — M25551 Pain in right hip: Secondary | ICD-10-CM | POA: Diagnosis not present

## 2022-05-22 ENCOUNTER — Telehealth: Payer: Self-pay

## 2022-05-22 NOTE — Telephone Encounter (Signed)
I spoke with the patient and informed her of the results of the EMG/NCV. She saw Dr. Ophelia Charter at [facility] and obtained an MRI. Dr. Ophelia Charter advised her to get a hip replacement (right sided).  Dr. Ophelia Charter office also ordered physical therapy. She spoke with physical therapy and they informed her she could not get PT for these two issues simultaneously.  She said her left leg is annoying, but does not prevent ADLs. She is unable to ambulate without discomfort due to her right leg/hip injury.  I advised her to prioritize hip replacement and PT and informed her I would confirm this with Dr. Frances Furbish.

## 2022-05-22 NOTE — Telephone Encounter (Signed)
I informed the patient that Dr. Frances Furbish was in agreement to the plan. She verbalized understanding and expressed appreciation for the call.

## 2022-05-22 NOTE — Telephone Encounter (Signed)
Thank you, I agree. Nothing further needed.

## 2022-05-22 NOTE — Telephone Encounter (Signed)
-----   Message from Huston Foley, MD sent at 05/18/2022  4:15 PM EDT ----- EMG and nerve conduction velocity testing recently supports meralgia paresthetica on the left side as suspected.  As discussed, this is a pinched nerve in the groin.  Please advise patient that she can pursue physical therapy as discussed with Dr. Lucia Gaskins, who kindly put a referral in to PT.  She should also follow-up as planned with her orthopedic doctor.  We can see her back in this clinic as needed.

## 2022-05-23 ENCOUNTER — Telehealth: Payer: Self-pay | Admitting: Orthopaedic Surgery

## 2022-05-23 NOTE — Telephone Encounter (Signed)
Patient asking if Dr. Ophelia Charter has looked at her MRI to see if she need surgery. Please advise

## 2022-05-23 NOTE — Telephone Encounter (Signed)
Please advise. Patient had MRI hip on 05/19/2022.

## 2022-05-26 NOTE — Telephone Encounter (Signed)
I called patient and left voice mail for return call. °

## 2022-05-29 ENCOUNTER — Ambulatory Visit (HOSPITAL_COMMUNITY): Payer: Medicare Other

## 2022-05-31 ENCOUNTER — Encounter: Payer: Self-pay | Admitting: Orthopaedic Surgery

## 2022-05-31 NOTE — Telephone Encounter (Signed)
Called patient and scheduled surgery

## 2022-06-08 ENCOUNTER — Encounter: Payer: Self-pay | Admitting: Orthopaedic Surgery

## 2022-06-08 ENCOUNTER — Other Ambulatory Visit: Payer: Self-pay | Admitting: Physician Assistant

## 2022-06-08 ENCOUNTER — Ambulatory Visit (INDEPENDENT_AMBULATORY_CARE_PROVIDER_SITE_OTHER): Payer: Medicare Other | Admitting: Orthopaedic Surgery

## 2022-06-08 VITALS — Ht 61.0 in | Wt 138.0 lb

## 2022-06-08 DIAGNOSIS — M1611 Unilateral primary osteoarthritis, right hip: Secondary | ICD-10-CM

## 2022-06-08 NOTE — H&P (View-Only) (Signed)
 Office Visit Note   Patient: Courtney Wallace           Date of Birth: 04/12/1955           MRN: [id number] Visit Date: 06/08/2022              Requested by: Williams, Gordon F, MD [address] [place],  [place] [address] PCP: Williams, Gordon F, MD   Assessment & Plan: Visit Diagnoses:  1. Unilateral primary osteoarthritis, right hip     Plan: We discussed total hip arthroplasty usual spinal anesthesia use of Exparel, Marcaine, postop ambulation with a walker usual overnight stay in the hospital.  Questions were elicited and answered.  Patient understands request we proceed with with right total of arthroplasty.  MRI scan images reviewed I gave her a copy of the report.  Follow-Up Instructions: No follow-ups on file.   Orders:  No orders of the defined types were placed in this encounter.  No orders of the defined types were placed in this encounter.     Procedures: No procedures performed   Clinical Data: No additional findings.   Subjective: Chief Complaint  Patient presents with   history and physical    06/28/2022 Right THA    HPI patient with continued problems with right hip pain pain in her groin that radiates down to her knee with x-ray showing mild to moderate arthritis.  MRI scan has been obtained on 05/22/2022 which shows more severe osteoarthritis with areas of full cartilage loss.  She has labral tearing anteriorly and superiorly.  Incidental 3 cm uterine fibroid noted which I discussed with her today as well.  She has no problems with the left hip.  Review of Systems past problems with hemorrhoids.  All other systems noncontributory to HPI.   Objective: Vital Signs: Ht 5' 1" (1.549 m)   Wt 138 lb (62.6 kg)   LMP 02/06/2013   BMI 26.07 kg/m   Physical Exam Constitutional:      Appearance: She is well-developed.  HENT:     Head: Normocephalic.     Right Ear: External ear normal.     Left Ear: External ear normal. There is no impacted cerumen.  Eyes:      Pupils: Pupils are equal, round, and reactive to light.  Neck:     Thyroid: No thyromegaly.     Trachea: No tracheal deviation.  Cardiovascular:     Rate and Rhythm: Normal rate.  Pulmonary:     Effort: Pulmonary effort is normal.  Abdominal:     Palpations: Abdomen is soft.  Musculoskeletal:     Cervical back: No rigidity.  Skin:    General: Skin is warm and dry.  Neurological:     Mental Status: She is alert and oriented to person, place, and time.  Psychiatric:        Behavior: Behavior normal.     Ortho Exam sharp pain with right hip internal rotation limited to 15 degrees.  Pain with straight leg raising in the right groin.  Negative logroll left hips.  Reflexes are 2+ symmetrical anterior tib gastrocsoleus is intact and symmetrical.  No sciatic notch tenderness.  Specialty Comments:  No specialty comments available.  Imaging: Narrative & Impression  CLINICAL DATA:  Right hip pain for 6 months.  No known injury.   EXAM: MR OF THE RIGHT HIP WITHOUT CONTRAST   TECHNIQUE: Multiplanar, multisequence MR imaging was performed. No intravenous contrast was administered.   COMPARISON:  None   Available.   FINDINGS: Bones: There is no acute bony or joint abnormality. No fracture or stress change. No avascular necrosis of the femoral heads. Subchondral cyst formation and edema about the hips is much worse on the right.   Articular cartilage and labrum   Articular cartilage: Denuded on the right with bone-on-bone joint space narrowing.   Labrum: There is degenerative tearing the right anterior and anterior, superior labrum.   Joint or bursal effusion   Joint effusion: Small to moderate right hip joint effusion contains some debris   Bursae: Negative.   Muscles and tendons   Muscles and tendons:  Intact and normal in appearance.   Other findings   Miscellaneous:   3 cm uterine fibroid is noted.   IMPRESSION: 1. Severe right hip osteoarthritis with a  small to moderate joint effusion containing debris. 2. Moderate appearing left hip osteoarthritis. 3. 3 cm uterine fibroid.     Electronically Signed   By: Thomas  Dalessio M.D.   On: 05/22/2022 10:21     PMFS History: Patient Active Problem List   Diagnosis Date Noted   Unilateral primary osteoarthritis, right hip 05/12/2022   Screening for colon cancer 02/07/2022   Benign neoplasm of transverse colon 02/07/2022   Hemorrhoids without complication 02/07/2022   Past Medical History:  Diagnosis Date   Arm mass, left    Bursitis    right hip   Dyslipidemia    Fatty liver    GERD (gastroesophageal reflux disease)    Prediabetes    Right hip pain    bursitis    Family History  Problem Relation Age of Onset   Dementia Mother    Melanoma Mother    Cirrhosis Father    Breast cancer Sister    Neuropathy Neg Hx     Past Surgical History:  Procedure Laterality Date   COLONOSCOPY  08/30/2010   Procedure: COLONOSCOPY;  Surgeon: Macarthur Lorusso A Jenkins;  [facility];  Service: Gastroenterology;  Laterality: N/A;   COLONOSCOPY WITH PROPOFOL N/A 02/07/2022   Procedure: COLONOSCOPY WITH PROPOFOL;  Surgeon: Pappayliou, Catherine A, DO;  Location: [facility];  Service: General;  Laterality: N/A;   DILATION AND CURETTAGE OF UTERUS  yrs ago   MASS EXCISION Left 10/02/2019   Procedure: EXCISION LEFT ARM SUBCUTANEOUS MASS;  Surgeon: Kinsinger, Luke Aaron, MD;  Location: [facility];  Service: General;  Laterality: Left;   POLYPECTOMY  02/07/2022   Procedure: POLYPECTOMY;  Surgeon: Pappayliou, Catherine A, DO;  [facility]: [facility];  Service: General;;   TONSILLECTOMY  age 19   Social History   Occupational History   Not on file  Tobacco Use   Smoking status: Never   Smokeless tobacco: Never  Vaping Use   Vaping Use: Never used  Substance and Sexual Activity   Alcohol use: No   Drug use: No   Sexual activity: Not on file        

## 2022-06-08 NOTE — Progress Notes (Signed)
Office Visit Note   Patient: Courtney Wallace           Date of Birth: [birth year]           MRN: [id number] Visit Date: 06/08/2022              Requested by: Donetta Potts, MD [address] [place],  [place] [address] PCP: Donetta Potts, MD   Assessment & Plan: Visit Diagnoses:  1. Unilateral primary osteoarthritis, right hip     Plan: We discussed total hip arthroplasty usual spinal anesthesia use of Exparel, Marcaine, postop ambulation with a walker usual overnight stay in the hospital.  Questions were elicited and answered.  Patient understands request we proceed with with right total of arthroplasty.  MRI scan images reviewed I gave her a copy of the report.  Follow-Up Instructions: No follow-ups on file.   Orders:  No orders of the defined types were placed in this encounter.  No orders of the defined types were placed in this encounter.     Procedures: No procedures performed   Clinical Data: No additional findings.   Subjective: Chief Complaint  Patient presents with   history and physical    06/28/2022 Right THA    HPI patient with continued problems with right hip pain pain in her groin that radiates down to her knee with x-ray showing mild to moderate arthritis.  MRI scan has been obtained on 05/22/2022 which shows more severe osteoarthritis with areas of full cartilage loss.  She has labral tearing anteriorly and superiorly.  Incidental 3 cm uterine fibroid noted which I discussed with her today as well.  She has no problems with the left hip.  Review of Systems past problems with hemorrhoids.  All other systems noncontributory to HPI.   Objective: Vital Signs: Ht 5\' 1"  (1.549 m)   Wt 138 lb (62.6 kg)   LMP 02/06/2013   BMI 26.07 kg/m   Physical Exam Constitutional:      Appearance: She is well-developed.  HENT:     Head: Normocephalic.     Right Ear: External ear normal.     Left Ear: External ear normal. There is no impacted cerumen.  Eyes:      Pupils: Pupils are equal, round, and reactive to light.  Neck:     Thyroid: No thyromegaly.     Trachea: No tracheal deviation.  Cardiovascular:     Rate and Rhythm: Normal rate.  Pulmonary:     Effort: Pulmonary effort is normal.  Abdominal:     Palpations: Abdomen is soft.  Musculoskeletal:     Cervical back: No rigidity.  Skin:    General: Skin is warm and dry.  Neurological:     Mental Status: She is alert and oriented to person, place, and time.  Psychiatric:        Behavior: Behavior normal.     Ortho Exam sharp pain with right hip internal rotation limited to 15 degrees.  Pain with straight leg raising in the right groin.  Negative logroll left hips.  Reflexes are 2+ symmetrical anterior tib gastrocsoleus is intact and symmetrical.  No sciatic notch tenderness.  Specialty Comments:  No specialty comments available.  Imaging: Narrative & Impression  CLINICAL DATA:  Right hip pain for 6 months.  No known injury.   EXAM: MR OF THE RIGHT HIP WITHOUT CONTRAST   TECHNIQUE: Multiplanar, multisequence MR imaging was performed. No intravenous contrast was administered.   COMPARISON:  None  Available.   FINDINGS: Bones: There is no acute bony or joint abnormality. No fracture or stress change. No avascular necrosis of the femoral heads. Subchondral cyst formation and edema about the hips is much worse on the right.   Articular cartilage and labrum   Articular cartilage: Denuded on the right with bone-on-bone joint space narrowing.   Labrum: There is degenerative tearing the right anterior and anterior, superior labrum.   Joint or bursal effusion   Joint effusion: Small to moderate right hip joint effusion contains some debris   Bursae: Negative.   Muscles and tendons   Muscles and tendons:  Intact and normal in appearance.   Other findings   Miscellaneous:   3 cm uterine fibroid is noted.   IMPRESSION: 1. Severe right hip osteoarthritis with a  small to moderate joint effusion containing debris. 2. Moderate appearing left hip osteoarthritis. 3. 3 cm uterine fibroid.     Electronically Signed   By: Drusilla Kanner M.D.   On: 05/22/2022 10:21     PMFS History: Patient Active Problem List   Diagnosis Date Noted   Unilateral primary osteoarthritis, right hip 05/12/2022   Screening for colon cancer 02/07/2022   Benign neoplasm of transverse colon 02/07/2022   Hemorrhoids without complication 02/07/2022   Past Medical History:  Diagnosis Date   Arm mass, left    Bursitis    right hip   Dyslipidemia    Fatty liver    GERD (gastroesophageal reflux disease)    Prediabetes    Right hip pain    bursitis    Family History  Problem Relation Age of Onset   Dementia Mother    Melanoma Mother    Cirrhosis Father    Breast cancer Sister    Neuropathy Neg Hx     Past Surgical History:  Procedure Laterality Date   COLONOSCOPY  08/30/2010   Procedure: COLONOSCOPY;  Surgeon: Dalia Heading;  [facility];  Service: Gastroenterology;  Laterality: N/A;   COLONOSCOPY WITH PROPOFOL N/A 02/07/2022   Procedure: COLONOSCOPY WITH PROPOFOL;  Surgeon: Lewie Chamber, DO;  [facility]: [facility];  Service: General;  Laterality: N/A;   DILATION AND CURETTAGE OF UTERUS  yrs ago   MASS EXCISION Left 10/02/2019   Procedure: EXCISION LEFT ARM SUBCUTANEOUS MASS;  Surgeon: Kinsinger, De Blanch, MD;  Location: [facility];  Service: General;  Laterality: Left;   POLYPECTOMY  02/07/2022   Procedure: POLYPECTOMY;  Surgeon: Lewie Chamber, DO;  [facility];  Service: General;;   TONSILLECTOMY  age 28   Social History   Occupational History   Not on file  Tobacco Use   Smoking status: Never   Smokeless tobacco: Never  Vaping Use   Vaping Use: Never used  Substance and Sexual Activity   Alcohol use: No   Drug use: No   Sexual activity: Not on file

## 2022-06-20 NOTE — Pre-Procedure Instructions (Signed)
Surgical Instructions    Your procedure is scheduled on Wednesday, May 22nd.  Report to [facility] Main Entrance "A" at 10:30 A.M., then check in with the Admitting office.  Call this number if you have problems the morning of surgery:  [phone]  If you have any questions prior to your surgery date call [phone]: Open Monday-Friday 8am-4pm If you experience any cold or flu symptoms such as cough, fever, chills, shortness of breath, etc. between now and your scheduled surgery, please notify us at the above number.     Remember:  Do not eat after midnight the night before your surgery  You may drink clear liquids until 09:30 AM the morning of your surgery.   Clear liquids allowed are: Water, Non-Citrus Juices (without pulp), Carbonated Beverages, Clear Tea, Black Coffee Only (NO MILK, CREAM OR POWDERED CREAMER of any kind), and Gatorade.   Patient Instructions  The night before surgery:  No food after midnight. ONLY clear liquids after midnight  The day of surgery (if you do NOT have diabetes):  Drink ONE (1) Pre-Surgery Clear Ensure by 09:30 AM the morning of surgery. Drink in one sitting. Do not sip.  This drink was given to you during your hospital  pre-op appointment visit.  Nothing else to drink after completing the  Pre-Surgery Clear Ensure.          If you have questions, please contact your surgeon's office.     Take these medicines the morning of surgery with A SIP OF WATER  If needed: acetaminophen (TYLENOL)    As of today, STOP taking any Aspirin (unless otherwise instructed by your surgeon) Aleve, Naproxen, Ibuprofen, Motrin, Advil, Goody's, BC's, all herbal medications, fish oil, and all vitamins.                     Do NOT Smoke (Tobacco/Vaping) for 24 hours prior to your procedure.  If you use a CPAP at night, you may bring your mask/headgear for your overnight stay.   Contacts, glasses, piercing's, hearing aid's, dentures or partials may not be  worn into surgery, please bring cases for these belongings.    For patients admitted to the hospital, discharge time will be determined by your treatment team.   Patients discharged the day of surgery will not be allowed to drive home, and someone needs to stay with them for 24 hours.  SURGICAL WAITING ROOM VISITATION Patients having surgery or a procedure may have no more than 2 support people in the waiting area - these visitors may rotate.   Children under the age of 23 must have an adult with them who is not the patient. If the patient needs to stay at the hospital during part of their recovery, the visitor guidelines for inpatient rooms apply. Pre-op nurse will coordinate an appropriate time for 1 support person to accompany patient in pre-op.  This support person may not rotate.   Please refer to the [facility] website for the visitor guidelines for Inpatients (after your surgery is over and you are in a regular room).     Pre-operative 5 CHG Bath Instructions   You can play a key role in reducing the risk of infection after surgery. Your skin needs to be as free of germs as possible. You can reduce the number of germs on your skin by washing with CHG (chlorhexidine gluconate) soap before surgery. CHG is an antiseptic soap that kills germs and continues to kill germs even after washing.  DO NOT use if you have an allergy to chlorhexidine/CHG or antibacterial soaps. If your skin becomes reddened or irritated, stop using the CHG and notify one of our RNs at [phone].   Please shower with the CHG soap starting 4 days before surgery using the following schedule:     Please keep in mind the following:  DO NOT shave, including legs and underarms, starting the day of your first shower.   You may shave your face at any point before/day of surgery.  Place clean sheets on your bed the day you start using CHG soap. Use a clean washcloth (not used since being washed) for each shower. DO  NOT sleep with pets once you start using the CHG.   CHG Shower Instructions:  If you choose to wash your hair and private area, wash first with your normal shampoo/soap.  After you use shampoo/soap, rinse your hair and body thoroughly to remove shampoo/soap residue.  Turn the water OFF and apply about 3 tablespoons (45 ml) of CHG soap to a CLEAN washcloth.  Apply CHG soap ONLY FROM YOUR NECK DOWN TO YOUR TOES (washing for 3-5 minutes)  DO NOT use CHG soap on face, private areas, open wounds, or sores.  Pay special attention to the area where your surgery is being performed.  If you are having back surgery, having someone wash your back for you may be helpful. Wait 2 minutes after CHG soap is applied, then you may rinse off the CHG soap.  Pat dry with a clean towel  Put on clean clothes/pajamas   If you choose to wear lotion, please use ONLY the CHG-compatible lotions on the back of this paper.     Additional instructions for the day of surgery: DO NOT APPLY any lotions, deodorants, cologne, or perfumes.   Put on clean/comfortable clothes.  Brush your teeth.  Ask your nurse before applying any prescription medications to the skin. Do not wear jewelry or makeup Do not bring valuables to the hospital. [facility] is not responsible for any belongings or valuables. Do not wear nail polish, gel polish, artificial nails, or any other type of covering on natural nails (fingers and toes) If you have artificial nails or gel coating that need to be removed by a nail salon, please have this removed prior to surgery. Artificial nails or gel coating may interfere with anesthesia's ability to adequately monitor your vital signs.     CHG Compatible Lotions   Aveeno Moisturizing lotion  Cetaphil Moisturizing Cream  Cetaphil Moisturizing Lotion  Clairol Herbal Essence Moisturizing Lotion, Dry Skin  Clairol Herbal Essence Moisturizing Lotion, Extra Dry Skin  Clairol Herbal Essence Moisturizing  Lotion, Normal Skin  Curel Age Defying Therapeutic Moisturizing Lotion with Alpha Hydroxy  Curel Extreme Care Body Lotion  Curel Soothing Hands Moisturizing Hand Lotion  Curel Therapeutic Moisturizing Cream, Fragrance-Free  Curel Therapeutic Moisturizing Lotion, Fragrance-Free  Curel Therapeutic Moisturizing Lotion, Original Formula  Eucerin Daily Replenishing Lotion  Eucerin Dry Skin Therapy Plus Alpha Hydroxy Crme  Eucerin Dry Skin Therapy Plus Alpha Hydroxy Lotion  Eucerin Original Crme  Eucerin Original Lotion  Eucerin Plus Crme Eucerin Plus Lotion  Eucerin TriLipid Replenishing Lotion  Keri Anti-Bacterial Hand Lotion  Keri Deep Conditioning Original Lotion Dry Skin Formula Softly Scented  Keri Deep Conditioning Original Lotion, Fragrance Free Sensitive Skin Formula  Keri Lotion Fast Absorbing Fragrance Free Sensitive Skin Formula  Keri Lotion Fast Absorbing Softly Scented Dry Skin Formula  Keri Original Lotion  Keri Skin  Renewal Lotion WellPoint Smooth Lotion  Keri Silky Smooth Sensitive Skin Lotion  Nivea Body Creamy Conditioning Oil  Nivea Body Extra Enriched Teacher, adult education Moisturizing Lotion Nivea Crme  Nivea Skin Firming Lotion  NutraDerm 30 Skin Lotion  NutraDerm Skin Lotion  NutraDerm Therapeutic Skin Cream  NutraDerm Therapeutic Skin Lotion  ProShield Protective Hand Cream  Provon moisturizing lotion       Please read over the following fact sheets that you were given.    If you received a COVID test during your pre-op visit  it is requested that you wear a mask when out in public, stay away from anyone that may not be feeling well and notify your surgeon if you develop symptoms. If you have been in contact with anyone that has tested positive in the last 10 days please notify you surgeon.

## 2022-06-21 ENCOUNTER — Encounter (HOSPITAL_COMMUNITY): Payer: Self-pay

## 2022-06-21 ENCOUNTER — Other Ambulatory Visit: Payer: Self-pay

## 2022-06-21 ENCOUNTER — Encounter (HOSPITAL_COMMUNITY)
Admission: RE | Admit: 2022-06-21 | Discharge: 2022-06-21 | Disposition: A | Discharge status: Home / Self Care | Payer: Medicare Other | Source: Ambulatory Visit | Attending: Orthopaedic Surgery | Admitting: Orthopaedic Surgery

## 2022-06-21 VITALS — BP 120/61 | HR 76 | Temp 98.2°F | Resp 17 | Ht 61.0 in | Wt 143.0 lb

## 2022-06-21 DIAGNOSIS — Z01818 Encounter for other preprocedural examination: Secondary | ICD-10-CM

## 2022-06-21 DIAGNOSIS — Z01812 Encounter for preprocedural laboratory examination: Secondary | ICD-10-CM | POA: Insufficient documentation

## 2022-06-21 DIAGNOSIS — K760 Fatty (change of) liver, not elsewhere classified: Secondary | ICD-10-CM | POA: Diagnosis not present

## 2022-06-21 HISTORY — DX: Bilateral primary osteoarthritis of hip: M16.0

## 2022-06-21 LAB — COMPREHENSIVE METABOLIC PANEL
ALT: 23 U/L (ref 0–44)
AST: 20 U/L (ref 15–41)
Albumin: 3.7 g/dL (ref 3.5–5.0)
Alkaline Phosphatase: 69 U/L (ref 38–126)
Anion gap: 6 (ref 5–15)
BUN: 18 mg/dL (ref 8–23)
CO2: 26 mmol/L (ref 22–32)
Calcium: 9.5 mg/dL (ref 8.9–10.3)
Chloride: 107 mmol/L (ref 98–111)
Creatinine, Ser: 0.65 mg/dL (ref 0.44–1.00)
GFR, Estimated: >60 mL/min (ref >60)
Glucose, Bld: 108 mg/dL — ABNORMAL HIGH (ref 70–99)
Potassium: 3.9 mmol/L (ref 3.5–5.1)
Sodium: 139 mmol/L (ref 135–145)
Total Bilirubin: 0.4 mg/dL (ref 0.3–1.2)
Total Protein: 6.6 g/dL (ref 6.5–8.1)

## 2022-06-21 LAB — CBC
HCT: 39 % (ref 36.0–46.0)
Hemoglobin: 12.7 g/dL (ref 12.0–15.0)
MCH: 29.6 pg (ref 26.0–34.0)
MCHC: 32.6 g/dL (ref 30.0–36.0)
MCV: 90.9 fL (ref 80.0–100.0)
Platelets: 350 10*3/uL (ref 150–400)
RBC: 4.29 MIL/uL (ref 3.87–5.11)
RDW: 13 % (ref 11.5–15.5)
WBC: 5.5 10*3/uL (ref 4.0–10.5)
nRBC: 0 % (ref 0.0–0.2)

## 2022-06-21 LAB — TYPE AND SCREEN
ABO/RH(D): O NEG
Antibody Screen: NEGATIVE

## 2022-06-21 LAB — SURGICAL PCR SCREEN
MRSA, PCR: NEGATIVE
Staphylococcus aureus: NEGATIVE

## 2022-06-21 NOTE — Progress Notes (Signed)
PCP - Dr. Mitzi Hansen Cardiologist - denies  PPM/ICD - denies   Chest x-ray - 04/13/16 EKG - denies Stress Test - denies ECHO - denies Cardiac Cath - denies  Sleep Study - denies   DM- denies  ASA/Blood Thinner Instructions: n/a   ERAS Protcol - yes PRE-SURGERY Ensure given at PAT  COVID TEST- n/a   Anesthesia review: no  Patient denies shortness of breath, fever, cough and chest pain at PAT appointment   All instructions explained to the patient, with a verbal understanding of the material. Patient agrees to go over the instructions while at home for a better understanding. The opportunity to ask questions was provided.

## 2022-06-26 ENCOUNTER — Telehealth: Payer: Self-pay | Admitting: *Deleted

## 2022-06-26 NOTE — Telephone Encounter (Signed)
Attempted Ortho bundle pre-op call. No answer and left VM requesting call back. 

## 2022-06-28 ENCOUNTER — Encounter (HOSPITAL_COMMUNITY)
Admission: RE | Disposition: A | Discharge status: Home / Self Care | Payer: Self-pay | Source: Home / Self Care | Attending: Orthopaedic Surgery

## 2022-06-28 ENCOUNTER — Other Ambulatory Visit: Payer: Self-pay | Admitting: Physician Assistant

## 2022-06-28 ENCOUNTER — Encounter (HOSPITAL_COMMUNITY): Payer: Self-pay | Admitting: Orthopaedic Surgery

## 2022-06-28 ENCOUNTER — Ambulatory Visit (HOSPITAL_BASED_OUTPATIENT_CLINIC_OR_DEPARTMENT_OTHER): Payer: Medicare Other | Admitting: Anesthesiology

## 2022-06-28 ENCOUNTER — Other Ambulatory Visit: Payer: Self-pay

## 2022-06-28 ENCOUNTER — Ambulatory Visit (HOSPITAL_COMMUNITY): Payer: Medicare Other | Admitting: Physician Assistant

## 2022-06-28 ENCOUNTER — Ambulatory Visit (HOSPITAL_COMMUNITY): Payer: Medicare Other

## 2022-06-28 ENCOUNTER — Observation Stay (HOSPITAL_COMMUNITY)
Admission: RE | Admit: 2022-06-28 | Discharge: 2022-06-29 | Disposition: A | Discharge status: Home / Self Care | Payer: Medicare Other | Attending: Orthopaedic Surgery | Admitting: Orthopaedic Surgery

## 2022-06-28 DIAGNOSIS — Z79899 Other long term (current) drug therapy: Secondary | ICD-10-CM | POA: Diagnosis not present

## 2022-06-28 DIAGNOSIS — Z471 Aftercare following joint replacement surgery: Secondary | ICD-10-CM | POA: Diagnosis not present

## 2022-06-28 DIAGNOSIS — Z96641 Presence of right artificial hip joint: Secondary | ICD-10-CM | POA: Diagnosis present

## 2022-06-28 DIAGNOSIS — M1611 Unilateral primary osteoarthritis, right hip: Secondary | ICD-10-CM | POA: Diagnosis not present

## 2022-06-28 HISTORY — PX: TOTAL HIP ARTHROPLASTY: SHX124

## 2022-06-28 LAB — ABO/RH: ABO/RH(D): O NEG

## 2022-06-28 SURGERY — ARTHROPLASTY, HIP, TOTAL, ANTERIOR APPROACH
Anesthesia: Spinal | Site: Hip | Laterality: Right

## 2022-06-28 MED ORDER — MIDAZOLAM HCL 2 MG/2ML IJ SOLN
INTRAMUSCULAR | Status: DC | PRN
  Administered 2022-06-28: 2 mg via INTRAVENOUS

## 2022-06-28 MED ORDER — BUPIVACAINE LIPOSOME 1.3 % IJ SUSP
INTRAMUSCULAR | Status: DC | PRN
  Administered 2022-06-28: 10 mL

## 2022-06-28 MED ORDER — ONDANSETRON HCL 4 MG/2ML IJ SOLN: 4.0000 mg | Freq: Four times a day (QID) | INTRAMUSCULAR | Status: DC | PRN

## 2022-06-28 MED ORDER — PHENOL 1.4 % MT LIQD: 1.0000 | OROMUCOSAL | Status: DC | PRN

## 2022-06-28 MED ORDER — PROPOFOL 500 MG/50ML IV EMUL
INTRAVENOUS | Status: DC | PRN
  Administered 2022-06-28: 100 ug/kg/min via INTRAVENOUS

## 2022-06-28 MED ORDER — MIDAZOLAM HCL 2 MG/2ML IJ SOLN
INTRAMUSCULAR | Status: AC
  Filled 2022-06-28: qty 2

## 2022-06-28 MED ORDER — ONDANSETRON HCL 4 MG/2ML IJ SOLN
INTRAMUSCULAR | Status: DC | PRN
  Administered 2022-06-28: 4 mg via INTRAVENOUS

## 2022-06-28 MED ORDER — SODIUM CHLORIDE 0.45 % IV SOLN: INTRAVENOUS | Status: DC

## 2022-06-28 MED ORDER — ASPIRIN 81 MG PO CHEW
81.0000 mg | CHEWABLE_TABLET | Freq: Two times a day (BID) | ORAL | Status: DC
  Administered 2022-06-28 – 2022-06-29 (×2): 81 mg via ORAL
  Filled 2022-06-28 (×2): qty 1

## 2022-06-28 MED ORDER — FERROUS SULFATE 325 (65 FE) MG PO TABS
325.0000 mg | ORAL_TABLET | ORAL | Status: DC
  Administered 2022-06-29: 325 mg via ORAL
  Filled 2022-06-28: qty 1

## 2022-06-28 MED ORDER — LYSINE 1000 MG PO TABS: 1000.0000 mg | ORAL_TABLET | Freq: Every day | ORAL | Status: DC

## 2022-06-28 MED ORDER — ACETAMINOPHEN 325 MG PO TABS: 325.0000 mg | ORAL_TABLET | Freq: Four times a day (QID) | ORAL | Status: DC | PRN

## 2022-06-28 MED ORDER — POLYETHYLENE GLYCOL 3350 17 G PO PACK: 17.0000 g | PACK | Freq: Every day | ORAL | Status: DC | PRN

## 2022-06-28 MED ORDER — DEXAMETHASONE SODIUM PHOSPHATE 10 MG/ML IJ SOLN
INTRAMUSCULAR | Status: DC | PRN
  Administered 2022-06-28: 10 mg via INTRAVENOUS

## 2022-06-28 MED ORDER — FENTANYL CITRATE (PF) 250 MCG/5ML IJ SOLN
INTRAMUSCULAR | Status: AC
  Filled 2022-06-28: qty 5

## 2022-06-28 MED ORDER — PROMETHAZINE HCL 25 MG/ML IJ SOLN: 6.2500 mg | INTRAMUSCULAR | Status: DC | PRN

## 2022-06-28 MED ORDER — LACTATED RINGERS IV SOLN: INTRAVENOUS | Status: DC

## 2022-06-28 MED ORDER — CALCIUM CARBONATE 1250 (500 CA) MG PO TABS
1.0000 | ORAL_TABLET | Freq: Every day | ORAL | Status: DC
  Administered 2022-06-28 – 2022-06-29 (×2): 1250 mg via ORAL
  Filled 2022-06-28 (×2): qty 1

## 2022-06-28 MED ORDER — VITAMIN D 25 MCG (1000 UNIT) PO TABS
1000.0000 [IU] | ORAL_TABLET | Freq: Every day | ORAL | Status: DC
  Administered 2022-06-28 – 2022-06-29 (×2): 1000 [IU] via ORAL
  Filled 2022-06-28 (×2): qty 1

## 2022-06-28 MED ORDER — BUPIVACAINE IN DEXTROSE 0.75-8.25 % IT SOLN
INTRATHECAL | Status: DC | PRN
  Administered 2022-06-28: 1.8 mL via INTRATHECAL

## 2022-06-28 MED ORDER — HYDROMORPHONE HCL 1 MG/ML IJ SOLN
0.2500 mg | INTRAMUSCULAR | Status: DC | PRN
  Administered 2022-06-28: 0.25 mg via INTRAVENOUS
  Administered 2022-06-28: 0.5 mg via INTRAVENOUS
  Administered 2022-06-28 (×3): 0.25 mg via INTRAVENOUS

## 2022-06-28 MED ORDER — EPHEDRINE SULFATE-NACL 50-0.9 MG/10ML-% IV SOSY
PREFILLED_SYRINGE | INTRAVENOUS | Status: DC | PRN
  Administered 2022-06-28: 10 mg via INTRAVENOUS

## 2022-06-28 MED ORDER — CHLORHEXIDINE GLUCONATE 0.12 % MT SOLN
15.0000 mL | Freq: Once | OROMUCOSAL | Status: AC
  Administered 2022-06-28: 15 mL via OROMUCOSAL
  Filled 2022-06-28: qty 15

## 2022-06-28 MED ORDER — METHOCARBAMOL 500 MG PO TABS
ORAL_TABLET | ORAL | Status: AC
  Filled 2022-06-28: qty 1

## 2022-06-28 MED ORDER — METHOCARBAMOL 1000 MG/10ML IJ SOLN: 500.0000 mg | Freq: Four times a day (QID) | INTRAVENOUS | Status: DC | PRN

## 2022-06-28 MED ORDER — DIPHENHYDRAMINE HCL 12.5 MG/5ML PO ELIX: 12.5000 mg | ORAL_SOLUTION | ORAL | Status: DC | PRN

## 2022-06-28 MED ORDER — GABAPENTIN 300 MG PO CAPS
300.0000 mg | ORAL_CAPSULE | Freq: Every day | ORAL | Status: DC
  Administered 2022-06-28: 300 mg via ORAL
  Filled 2022-06-28: qty 1

## 2022-06-28 MED ORDER — HYDROMORPHONE HCL 1 MG/ML IJ SOLN
INTRAMUSCULAR | Status: AC
  Filled 2022-06-28: qty 1

## 2022-06-28 MED ORDER — MENTHOL 3 MG MT LOZG: 1.0000 | LOZENGE | OROMUCOSAL | Status: DC | PRN

## 2022-06-28 MED ORDER — METHOCARBAMOL 500 MG PO TABS
500.0000 mg | ORAL_TABLET | Freq: Four times a day (QID) | ORAL | Status: DC | PRN
  Administered 2022-06-28: 500 mg via ORAL
  Filled 2022-06-28: qty 1

## 2022-06-28 MED ORDER — VITAMIN B-12 1000 MCG PO TABS
2500.0000 ug | ORAL_TABLET | Freq: Every day | ORAL | Status: DC
  Administered 2022-06-28 – 2022-06-29 (×2): 2500 ug via ORAL
  Filled 2022-06-28 (×2): qty 3

## 2022-06-28 MED ORDER — HYDROCODONE-ACETAMINOPHEN 5-325 MG PO TABS
1.0000 | ORAL_TABLET | ORAL | Status: DC | PRN
  Administered 2022-06-29: 1 via ORAL
  Filled 2022-06-28: qty 1

## 2022-06-28 MED ORDER — PHENYLEPHRINE HCL-NACL 20-0.9 MG/250ML-% IV SOLN
INTRAVENOUS | Status: DC | PRN
  Administered 2022-06-28: 20 ug/min via INTRAVENOUS

## 2022-06-28 MED ORDER — HYDROCODONE-ACETAMINOPHEN 7.5-325 MG PO TABS
1.0000 | ORAL_TABLET | ORAL | Status: DC | PRN
  Administered 2022-06-28: 2 via ORAL
  Administered 2022-06-28 – 2022-06-29 (×2): 1 via ORAL
  Administered 2022-06-29: 2 via ORAL
  Filled 2022-06-28: qty 1
  Filled 2022-06-28 (×3): qty 2

## 2022-06-28 MED ORDER — METOCLOPRAMIDE HCL 5 MG PO TABS: 5.0000 mg | ORAL_TABLET | Freq: Three times a day (TID) | ORAL | Status: DC | PRN

## 2022-06-28 MED ORDER — MORPHINE SULFATE (PF) 2 MG/ML IV SOLN: 0.5000 mg | INTRAVENOUS | Status: DC | PRN

## 2022-06-28 MED ORDER — DOCUSATE SODIUM 100 MG PO CAPS
100.0000 mg | ORAL_CAPSULE | Freq: Two times a day (BID) | ORAL | Status: DC
  Administered 2022-06-29: 100 mg via ORAL
  Filled 2022-06-28 (×2): qty 1

## 2022-06-28 MED ORDER — ONDANSETRON HCL 4 MG PO TABS: 4.0000 mg | ORAL_TABLET | Freq: Four times a day (QID) | ORAL | Status: DC | PRN

## 2022-06-28 MED ORDER — 0.9 % SODIUM CHLORIDE (POUR BTL) OPTIME
TOPICAL | Status: DC | PRN
  Administered 2022-06-28: 1000 mL

## 2022-06-28 MED ORDER — BUPIVACAINE HCL 0.5 % IJ SOLN
INTRAMUSCULAR | Status: DC | PRN
  Administered 2022-06-28: 10 mL

## 2022-06-28 MED ORDER — FENTANYL CITRATE (PF) 250 MCG/5ML IJ SOLN
INTRAMUSCULAR | Status: DC | PRN
  Administered 2022-06-28 (×2): 50 ug via INTRAVENOUS

## 2022-06-28 MED ORDER — ALBUMIN HUMAN 5 % IV SOLN: INTRAVENOUS | Status: DC | PRN

## 2022-06-28 MED ORDER — TRANEXAMIC ACID-NACL 1000-0.7 MG/100ML-% IV SOLN
1000.0000 mg | INTRAVENOUS | Status: AC
  Administered 2022-06-28: 1000 mg via INTRAVENOUS
  Filled 2022-06-28: qty 100

## 2022-06-28 MED ORDER — ACETAMINOPHEN 500 MG PO TABS
500.0000 mg | ORAL_TABLET | Freq: Four times a day (QID) | ORAL | Status: DC
  Administered 2022-06-28 – 2022-06-29 (×2): 500 mg via ORAL
  Filled 2022-06-28 (×2): qty 1

## 2022-06-28 MED ORDER — BUPIVACAINE LIPOSOME 1.3 % IJ SUSP
INTRAMUSCULAR | Status: AC
  Filled 2022-06-28: qty 20

## 2022-06-28 MED ORDER — METOCLOPRAMIDE HCL 5 MG/ML IJ SOLN: 5.0000 mg | Freq: Three times a day (TID) | INTRAMUSCULAR | Status: DC | PRN

## 2022-06-28 MED ORDER — CEFAZOLIN SODIUM-DEXTROSE 2-4 GM/100ML-% IV SOLN
2.0000 g | INTRAVENOUS | Status: AC
  Administered 2022-06-28: 2 g via INTRAVENOUS
  Filled 2022-06-28: qty 100

## 2022-06-28 MED ORDER — AMISULPRIDE (ANTIEMETIC) 5 MG/2ML IV SOLN: 10.0000 mg | Freq: Once | INTRAVENOUS | Status: DC | PRN

## 2022-06-28 MED ORDER — BUPIVACAINE HCL (PF) 0.5 % IJ SOLN
INTRAMUSCULAR | Status: AC
  Filled 2022-06-28: qty 30

## 2022-06-28 MED ORDER — ORAL CARE MOUTH RINSE: 15.0000 mL | Freq: Once | OROMUCOSAL | Status: AC

## 2022-06-28 SURGICAL SUPPLY — 59 items
APL SKNCLS STERI-STRIP NONHPOA (GAUZE/BANDAGES/DRESSINGS) ×1
BAG COUNTER SPONGE SURGICOUNT (BAG) ×1 IMPLANT
BAG SPNG CNTER NS LX DISP (BAG) ×1
BALL HIP CERAMIC (Hips) IMPLANT
BENZOIN TINCTURE PRP APPL 2/3 (GAUZE/BANDAGES/DRESSINGS) ×1 IMPLANT
BLADE CLIPPER SURG (BLADE) IMPLANT
BLADE SAW SGTL 18X1.27X75 (BLADE) ×1 IMPLANT
COVER PERINEAL POST (MISCELLANEOUS) ×1 IMPLANT
COVER SURGICAL LIGHT HANDLE (MISCELLANEOUS) ×1 IMPLANT
CUP SECTOR GRIPTON 50MM (Cup) IMPLANT
DRAPE C-ARM 42X72 X-RAY (DRAPES) ×1 IMPLANT
DRAPE IMP U-DRAPE 54X76 (DRAPES) ×1 IMPLANT
DRAPE STERI IOBAN 125X83 (DRAPES) ×1 IMPLANT
DRAPE U-SHAPE 47X51 STRL (DRAPES) ×3 IMPLANT
DRSG MEPILEX POST OP 4X8 (GAUZE/BANDAGES/DRESSINGS) ×1 IMPLANT
DURAPREP 26ML APPLICATOR (WOUND CARE) ×1 IMPLANT
ELECT BLADE 4.0 EZ CLEAN MEGAD (MISCELLANEOUS)
ELECT CAUTERY BLADE 6.4 (BLADE) ×1 IMPLANT
ELECT REM PT RETURN 9FT ADLT (ELECTROSURGICAL) ×1
ELECTRODE BLDE 4.0 EZ CLN MEGD (MISCELLANEOUS) IMPLANT
ELECTRODE REM PT RTRN 9FT ADLT (ELECTROSURGICAL) ×1 IMPLANT
ELIMINATOR HOLE APEX DEPUY (Hips) IMPLANT
FACESHIELD WRAPAROUND (MASK) ×2 IMPLANT
FACESHIELD WRAPAROUND OR TEAM (MASK) ×2 IMPLANT
GAUZE PAD ABD 8X10 STRL (GAUZE/BANDAGES/DRESSINGS) IMPLANT
GLOVE BIOGEL PI IND STRL 8 (GLOVE) ×2 IMPLANT
GLOVE ORTHO TXT STRL SZ7.5 (GLOVE) ×2 IMPLANT
GOWN STRL REUS W/ TWL LRG LVL3 (GOWN DISPOSABLE) ×1 IMPLANT
GOWN STRL REUS W/ TWL XL LVL3 (GOWN DISPOSABLE) ×1 IMPLANT
GOWN STRL REUS W/TWL 2XL LVL3 (GOWN DISPOSABLE) ×1 IMPLANT
GOWN STRL REUS W/TWL LRG LVL3 (GOWN DISPOSABLE) ×1
GOWN STRL REUS W/TWL XL LVL3 (GOWN DISPOSABLE) ×1
HIP BALL CERAMIC (Hips) ×1 IMPLANT
KIT BASIN OR (CUSTOM PROCEDURE TRAY) ×1 IMPLANT
KIT TURNOVER KIT B (KITS) ×1 IMPLANT
LINER ACETABULAR 32X50 (Liner) IMPLANT
MANIFOLD NEPTUNE II (INSTRUMENTS) ×1 IMPLANT
NDL HYPO 21X1 ECLIPSE (NEEDLE) ×1 IMPLANT
NEEDLE HYPO 21X1 ECLIPSE (NEEDLE) ×1 IMPLANT
NS IRRIG 1000ML POUR BTL (IV SOLUTION) ×1 IMPLANT
PACK TOTAL JOINT (CUSTOM PROCEDURE TRAY) ×1 IMPLANT
PAD ARMBOARD 7.5X6 YLW CONV (MISCELLANEOUS) ×2 IMPLANT
PULSAVAC PLUS IRRIG FAN TIP (DISPOSABLE)
STAPLER VISISTAT 35W (STAPLE) IMPLANT
STEM FEMORAL SZ 5MM STD ACTIS (Stem) IMPLANT
STRIP CLOSURE SKIN 1/2X4 (GAUZE/BANDAGES/DRESSINGS) ×1 IMPLANT
SUT VIC AB 0 CT1 27 (SUTURE) ×1
SUT VIC AB 0 CT1 27XBRD ANBCTR (SUTURE) ×1 IMPLANT
SUT VIC AB 2-0 CT1 27 (SUTURE) ×1
SUT VIC AB 2-0 CT1 TAPERPNT 27 (SUTURE) ×1 IMPLANT
SUT VICRYL 4-0 PS2 18IN ABS (SUTURE) ×1 IMPLANT
SUT VLOC 180 0 24IN GS25 (SUTURE) ×1 IMPLANT
SYR 20CC LL (SYRINGE) ×1 IMPLANT
TAPE CLOTH SURG 6X10 WHT LF (GAUZE/BANDAGES/DRESSINGS) IMPLANT
TIP FAN IRRIG PULSAVAC PLUS (DISPOSABLE) IMPLANT
TOWEL GREEN STERILE (TOWEL DISPOSABLE) ×2 IMPLANT
TOWEL GREEN STERILE FF (TOWEL DISPOSABLE) ×1 IMPLANT
TRAY FOLEY MTR SLVR 16FR STAT (SET/KITS/TRAYS/PACK) ×1 IMPLANT
WATER STERILE IRR 1000ML POUR (IV SOLUTION) ×2 IMPLANT

## 2022-06-28 NOTE — Anesthesia Preprocedure Evaluation (Addendum)
Anesthesia Evaluation  Patient identified by MRN, date of birth, ID band Patient awake    Reviewed: Allergy & Precautions, H&P , NPO status , Patient's Chart, lab work & pertinent test results  Airway Mallampati: III  TM Distance: >3 FB Neck ROM: Full    Dental no notable dental hx. (+) Dental Advisory Given   Pulmonary neg pulmonary ROS   Pulmonary exam normal breath sounds clear to auscultation       Cardiovascular negative cardio ROS Normal cardiovascular exam Rhythm:Regular Rate:Normal     Neuro/Psych negative neurological ROS  negative psych ROS   GI/Hepatic Neg liver ROS,GERD  ,,  Endo/Other  negative endocrine ROS    Renal/GU negative Renal ROS     Musculoskeletal  (+) Arthritis ,    Abdominal   Peds  Hematology negative hematology ROS (+)   Anesthesia Other Findings   Reproductive/Obstetrics negative OB ROS                             Anesthesia Physical Anesthesia Plan  ASA: 2  Anesthesia Plan: Spinal   Post-op Pain Management: Tylenol PO (pre-op)*   Induction: Intravenous  PONV Risk Score and Plan: 3 and Ondansetron, Treatment may vary due to age or medical condition, Propofol infusion and TIVA  Airway Management Planned:   Additional Equipment: None  Intra-op Plan:   Post-operative Plan:   Informed Consent: I have reviewed the patients History and Physical, chart, labs and discussed the procedure including the risks, benefits and alternatives for the proposed anesthesia with the patient or authorized representative who has indicated his/her understanding and acceptance.     Dental advisory given  Plan Discussed with: CRNA  Anesthesia Plan Comments:        Anesthesia Quick Evaluation

## 2022-06-28 NOTE — Interval H&P Note (Signed)
History and Physical Interval Note:  06/28/2022 11:54 AM  Courtney Wallace  has presented today for surgery, with the diagnosis of right hip osteoarthritis.  The various methods of treatment have been discussed with the patient and family. After consideration of risks, benefits and other options for treatment, the patient has consented to  Procedure(s) with comments: RIGHT TOTAL HIP ARTHROPLASTY ANTERIOR APPROACH (Right) - Needs RNFA as a surgical intervention.  The patient's history has been reviewed, patient examined, no change in status, stable for surgery.  I have reviewed the patient's chart and labs.  Questions were answered to the patient's satisfaction.     Eldred Manges

## 2022-06-28 NOTE — Evaluation (Signed)
Physical Therapy Evaluation Patient Details Name: Courtney Wallace MRN: [id number] DOB: March 26, 1955 Today's Date: 06/28/2022  History of Present Illness  67 y.o. female presents to [facility] hospital on 06/28/2022 for elective R THA. PMH includes LUE mass, HLD, GERD, fatty liver.  Clinical Impression  Pt presents to PT with deficits in strength, power, gait, balance, endurance. Pt is able to ambulate for short household distances with support of RW. PT provides cues to increased step/stride length and to increase R foot clearance, pt likely still being affected by anesthesia currently as she reports numbness and tingling in RLE. PT provides education on surgical hip HEP. PT will follow up tomorrow for further mobility training.       Recommendations for follow up therapy are one component of a multi-disciplinary discharge planning process, led by the attending physician.  Recommendations may be updated based on patient status, additional functional criteria and insurance authorization.  Follow Up Recommendations       Assistance Recommended at Discharge Intermittent Supervision/Assistance  Patient can return home with the following  A little help with walking and/or transfers;A little help with bathing/dressing/bathroom;Assistance with cooking/housework;Assist for transportation;Help with stairs or ramp for entrance    Equipment Recommendations BSC/3in1  Recommendations for Other Services       Functional Status Assessment Patient has had a recent decline in their functional status and demonstrates the ability to make significant improvements in function in a reasonable and predictable amount of time.     Precautions / Restrictions Precautions Precautions: Fall Precaution Comments: anterior hip, no precautions noted in chart Restrictions Weight Bearing Restrictions: Yes RLE Weight Bearing: Weight bearing as tolerated      Mobility  Bed Mobility Overal bed mobility: Needs Assistance Bed  Mobility: Supine to Sit, Sit to Supine     Supine to sit: Min assist, HOB elevated Sit to supine: Min assist, HOB elevated   General bed mobility comments: assist for RLE    Transfers Overall transfer level: Needs assistance Equipment used: Rolling walker (2 wheels) Transfers: Sit to/from Stand Sit to Stand: Min guard                Ambulation/Gait Ambulation/Gait assistance: Land (Feet): 40 Feet Assistive device: Rolling walker (2 wheels) Gait Pattern/deviations: Step-to pattern Gait velocity: reduced Gait velocity interpretation: <1.31 ft/sec, indicative of household ambulator   General Gait Details: slowed step-to gait, reduced foot clearance on RLE  Stairs            Wheelchair Mobility    Modified Rankin (Stroke Patients Only)       Balance Overall balance assessment: Needs assistance Sitting-balance support: No upper extremity supported, Feet supported Sitting balance-Leahy Scale: Good     Standing balance support: Single extremity supported, Reliant on assistive device for balance Standing balance-Leahy Scale: Poor                               Pertinent Vitals/Pain Pain Assessment Pain Assessment: 0-10 Pain Score: 5  Pain Location: R hip and wrist at IV site Pain Descriptors / Indicators: Sore Pain Intervention(s): Monitored during session, Premedicated before session    Home Living Family/patient expects to be discharged to:: Private residence Living Arrangements: Spouse/significant other Available Help at Discharge: Family;Available 24 hours/day;Friend(s) Type of Home: House Home Access: Stairs to enter Entrance Stairs-Rails: Right Entrance Stairs-Number of Steps: 3   Home Layout: Able to live on main level with bedroom/bathroom Home  Equipment: Agricultural consultant (2 wheels);Rollator (4 wheels);Cane - single point;Grab bars - toilet;Grab bars - tub/shower;Shower seat - built in      Prior Function Prior  Level of Function : Independent/Modified Independent;Driving                     Higher education careers adviser        Extremity/Trunk Assessment   Upper Extremity Assessment Upper Extremity Assessment: Overall WFL for tasks assessed    Lower Extremity Assessment Lower Extremity Assessment: RLE deficits/detail RLE Deficits / Details: generalized post-op weakness, pt reports continued numbness and tingling although has sensation to light touch RLE Sensation: decreased light touch    Cervical / Trunk Assessment Cervical / Trunk Assessment: Normal  Communication   Communication: No difficulties  Cognition Arousal/Alertness: Awake/alert Behavior During Therapy: WFL for tasks assessed/performed Overall Cognitive Status: Within Functional Limits for tasks assessed                                          General Comments General comments (skin integrity, edema, etc.): VSS on RA    Exercises Other Exercises Other Exercises: PT provides education on surgical hip exercise packet   Assessment/Plan    PT Assessment Patient needs continued PT services  PT Problem List Decreased strength;Decreased activity tolerance;Decreased balance;Decreased mobility;Decreased knowledge of use of DME;Pain       PT Treatment Interventions DME instruction;Gait training;Stair training;Functional mobility training;Therapeutic activities;Therapeutic exercise;Balance training;Neuromuscular re-education;Patient/family education    PT Goals (Current goals can be found in the Care Plan section)  Acute Rehab PT Goals Patient Stated Goal: to return home, get back on her tractor PT Goal Formulation: With patient Time For Goal Achievement: 07/02/22 Potential to Achieve Goals: Good    Frequency 7X/week     Co-evaluation               AM-PAC PT "6 Clicks" Mobility  Outcome Measure Help needed turning from your back to your side while in a flat bed without using bedrails?: A  Little Help needed moving from lying on your back to sitting on the side of a flat bed without using bedrails?: A Little Help needed moving to and from a bed to a chair (including a wheelchair)?: A Little Help needed standing up from a chair using your arms (e.g., wheelchair or bedside chair)?: A Little Help needed to walk in hospital room?: A Little Help needed climbing 3-5 steps with a railing? : Total 6 Click Score: 16    End of Session   Activity Tolerance: Patient tolerated treatment well Patient left: in bed;with call bell/phone within reach;with bed alarm set;with family/visitor present Nurse Communication: Mobility status PT Visit Diagnosis: Other abnormalities of gait and mobility (R26.89);Muscle weakness (generalized) (M62.81);Pain Pain - Right/Left: Right Pain - part of body: Hip    Time: 1914-7829 PT Time Calculation (min) (ACUTE ONLY): 30 min   Charges:   PT Evaluation $PT Eval Low Complexity: 1 Low          Arlyss Gandy, PT, DPT Acute Rehabilitation Office [phone]   Arlyss Gandy 06/28/2022, 5:37 PM

## 2022-06-28 NOTE — Anesthesia Procedure Notes (Addendum)
Spinal  Patient location during procedure: OR Start time: 06/28/2022 12:19 PM End time: 06/28/2022 12:26 PM Reason for block: surgical anesthesia Staffing Performed: anesthesiologist  Anesthesiologist: Lewie Loron, MD Performed by: Lewie Loron, MD Authorized by: Lewie Loron, MD   Preanesthetic Checklist Completed: patient identified, IV checked, site marked, risks and benefits discussed, surgical consent, monitors and equipment checked, pre-op evaluation and timeout performed Spinal Block Patient position: sitting Prep: DuraPrep and site prepped and draped Patient monitoring: heart rate, continuous pulse ox and blood pressure Approach: right paramedian Location: L2-3 Injection technique: single-shot Needle Needle type: Spinocan  Needle gauge: 25 G Needle length: 9 cm Additional Notes Expiration date of kit checked and confirmed. Patient tolerated procedure well, without complications.

## 2022-06-28 NOTE — Discharge Instructions (Signed)

## 2022-06-28 NOTE — Op Note (Addendum)
Pre and postop diagnosis: Right hip primary osteoarthritis  Procedure: Right total of arthroplasty, direct anterior approach  Surgeon: Annell Greening MD  Assistant: Youlanda Roys, RNFA  EBL: Less than 200 cc  Anesthesia spinal plus Exparel and Marcaine 10+10 = 20.  Complication: Posterior medial broach cortical defect.  Implants:UP SECTOR GRIPTON - [id number]  Inventory Item: CUP SECTOR GRIPTON Serial no.: Model/Cat no.: [id number]  Implant name: CUP SECTOR GRIPTON - [id number] Laterality: Right Area: Hip  Manufacturer: [employer] Date of Manufacture:   Action: Implanted Number Used: 1   Device Identifier: Device Identifier TypeWaylan Rocher APEX DEPUY - [id number]  Inventory Item: ELIMINATOR HOLE APEX DEPUY Serial no.: Model/Cat no.: [id number]  Implant nameWaylan Rocher APEX DEPUY - [id number] Laterality: Right Area: Hip  Manufacturer: [employer] Date of Manufacture:   Action: Implanted Number Used: 1   Device Identifier: Device Identifier Type:   LINER ACETABULAR 32X50 - [id number]  Inventory Item: LINER ACETABULAR 32X50 Serial no.: Model/Cat no.: [id number]  Implant name: LINER ACETABULAR 32X50 - [id number] Laterality: Right Area: Hip  Manufacturer: [employer] Date of Manufacture:   Action: Implanted Number Used: 1   Device Identifier: Device Identifier Type:   STEM FEMORAL SZ STD ACTIS - [id number]  Inventory Item: STEM FEMORAL SZ STD ACTIS Serial no.: Model/Cat no.: [id number]  Implant name: STEM FEMORAL SZ STD ACTIS - [id number] Laterality: Right Area: Hip  Manufacturer: [employer] Date of Manufacture:   Action: Implanted Number Used: 1   Device Identifier: Device Identifier Type:   HIP BALL CERAMIC - [id number]  Inventory Item: HIP BALL CERAMIC Serial no.: Model/Cat no.: [id number]  Implant name: HIP BALL CERAMIC - [id number] Laterality: Right Area: Hip  Manufacturer: [employer] Date of Manufacture:    Action: Implanted Number Used: 1   Device Identifier: Device Identifier Type:   Procedure after induction of spinal anesthesia Foley catheter placement Unna boot placement on the Hana table central post external rotation 20 degrees C-arm was brought in both hips were able to be visualized 1015 drapes have been applied.  Patient did not require abdomen being taped over for right hip.  Patient had moderate to severe hip osteoarthritis by MRI scan failed conservative treatment.  Area was prepped with DuraPrep blue sticky sheets were applied just inside the 1015 braid's and sterile skin marker large shower curtain Betadine Steri-Drape application sheet across sheet above.  Hydraulic arm was placed sealed with a piece of Betadine Steri-Drape.  Timeout procedure completed IV TXA given.  Decision was made to fingerbreadths lateral to inferior to the ASIS obliquely to the front of the trochanter.  Fascia was identified cleaned nicked extended and dull Carobel placed over the top of the capsule transverse arteries were coagulated capsule was opened gush of clear yellow synovial fluid anterior capsule was opened and neck was cut with C-arm visualization initially about 13 mm long later we came back and cut a little bit shorter.  Newman Pies was removed with corkscrew and progressive reaming of the acetabulum was performed up to 49 for 50 Placed with C-arm visualization with appropriate cup flexion and abduction.  Neutral liner was placed then impacted.  The apex hole eliminator centrally was placed it was extremely tight and no dome screw was needed.  Hydraulic arm was applied it was externally rotated to 120 taken down and under and cookie-cutter was used after posterior capsule was released same in all the external rotators.  Cookie cutter large curette rondure  was used to lateralize out into the trochanter to help with entry point.  Initial broach seem to be starting down appropriately and there was concern that was  angled slightly more posterior and medial.  Using the suction tip small cortical broach was noted just below the lesser trochanter.  Tried to put the broach down appropriately checked on the C arm and still appeared to go out the hole.  Will get the neck shorter by few millimeters with a new cut.  This gave more room for straight shot down the canal and easier to avoid posterior medial position and stay more anterior and lateral down the canal.  Continued to cookie-cutter laterally in the trochanter rondure curette and the large trochanteric  curved retractor was used and had been placed behind the trochanter in the normal fashion .    The broach went down centrally directly down the canal in good position.  We gradually progressed up and went all the way up to #5 sized which filled the canal tightly and with neck pain slightly sorter a +5 ball length restored leg lengths equally.  External rotation 90 leg was taken halfway down it was tight.  Marney Doctor was removed.  Some of the reamings and little piece of cancellous bone that had been scraped out of the trochanter was pushed out through the hole for a little bit of local bone graft and then the #5 stem was inserted.  Went well past the defect at least to shaft diameters.  No calcar reamer had been needed and collar was flush with the bone from the calcar cut AP and lateral fluoroscopic images showed good fit and fill.  The +5 ball that is been trialed ceramic was placed impacted secured hip was reduced identical findings with good stability measurement with the fluoroscopy showed leg lengths were equal.  Irrigation V-Loc closure in the fascial layer.  We reinspected prior to closure there was no areas of bleeding operative field was dry.  After V-Loc closure of the fascia some Exparel Marcaine was injected skin and subcutaneous tissue for postoperative analgesia.  2-0 Vicryl subcutaneous tissue skin staple closure postop dressing with Mepilex ABD tape and transferred  to care room.  Instrument count needle count was correct patient taught procedure well we discussed the small broach with initially that occurred patient will be able be weightbearing as tolerated with her walker.

## 2022-06-28 NOTE — H&P (Signed)
TOTAL HIP ADMISSION H&P  Patient is admitted for right total hip arthroplasty.  Subjective:  Chief Complaint: right hip pain  HPI: Courtney Wallace, 67 y.o. female, has a history of pain and functional disability in the right hip(s) due to arthritis and patient has failed non-surgical conservative treatments for greater than 12 weeks to include NSAID's and/or analgesics and activity modification.  Onset of symptoms was gradual starting 5 years ago with gradually worsening course since that time.The patient noted no past surgery on the right hip(s).  Patient currently rates pain in the right hip at 6 out of 10 with activity. Patient has night pain and worsening of pain with activity and weight bearing. Patient has evidence of subchondral cysts and subchondral sclerosis by imaging studies. This condition presents safety issues increasing the risk of falls. This patient has had There is no current active infection. HPI patient with continued problems with right hip pain pain in her groin that radiates down to her knee with x-ray showing mild to moderate arthritis.  MRI scan has been obtained on 05/22/2022 which shows more severe osteoarthritis with areas of full cartilage loss.  She has labral tearing anteriorly and superiorly.  Incidental 3 cm uterine fibroid noted which I discussed with her today as well.  She has no problems with the left hip.  Patient Active Problem List   Diagnosis Date Noted   Unilateral primary osteoarthritis, right hip 05/12/2022   Screening for colon cancer 02/07/2022   Benign neoplasm of transverse colon 02/07/2022   Hemorrhoids without complication 02/07/2022   Past Medical History:  Diagnosis Date   Arm mass, left    Bursitis    right hip   Dyslipidemia    Fatty liver    GERD (gastroesophageal reflux disease)    pt denies   Osteoarthritis, hip, bilateral    Prediabetes    Right hip pain    bursitis    Past Surgical History:  Procedure Laterality Date    COLONOSCOPY  08/30/2010   Procedure: COLONOSCOPY;  Surgeon: Dalia Heading;  Location: [facility];  Service: Gastroenterology;  Laterality: N/A;   COLONOSCOPY WITH PROPOFOL N/A 02/07/2022   Procedure: COLONOSCOPY WITH PROPOFOL;  Surgeon: Lewie Chamber, DO;  Location: [facility];  Service: General;  Laterality: N/A;   DILATION AND CURETTAGE OF UTERUS  yrs ago   MASS EXCISION Left 10/02/2019   Procedure: EXCISION LEFT ARM SUBCUTANEOUS MASS;  Surgeon: Kinsinger, De Blanch, MD;  Location: [facility];  Service: General;  Laterality: Left;   POLYPECTOMY  02/07/2022   Procedure: POLYPECTOMY;  Surgeon: Lewie Chamber, DO;  Location: [facility];  Service: General;;   TONSILLECTOMY  age 47   TUBAL LIGATION      No current facility-administered medications for this visit.   No current outpatient medications on file.   Facility-Administered Medications Ordered in Other Visits  Medication Dose Route Frequency Provider Last Rate Last Admin   ceFAZolin (ANCEF) IVPB 2g/100 mL premix  2 g Intravenous On Call to OR Kristiana Jacko, [place], [place]       lactated ringers infusion   Intravenous Continuous Shelton Silvas, MD 10 mL/hr at 06/28/22 1055 New Bag at 06/28/22 1055   tranexamic acid (CYKLOKAPRON) IVPB 1,000 mg  1,000 mg Intravenous To OR Tayten Bergdoll, [place], PA       Allergies  Allergen Reactions   Penicillins Rash    Has patient had a PCN reaction causing immediate rash, facial/tongue/throat swelling,  SOB or lightheadedness with hypotension: Yes Has patient had a PCN reaction causing severe rash involving mucus membranes or skin necrosis: No Has patient had a PCN reaction that required hospitalization No Has patient had a PCN reaction occurring within the last 10 years: No If all of the above answers are "NO", then may proceed with Cephalosporin use.     Social History   Tobacco Use   Smoking status: Never   Smokeless tobacco: Never  Substance Use Topics    Alcohol use: No    Family History  Problem Relation Age of Onset   Dementia Mother    Melanoma Mother    Cirrhosis Father    Breast cancer Sister    Neuropathy Neg Hx      Review of Systems  All other systems reviewed and are negative.   Objective:  Physical Exam     Office Visit Note              Patient: Courtney Wallace                                     Date of Birth: Feb 09, 1955                                                    MRN: [id number] Visit Date: 06/08/2022                                                                     Requested by: Donetta Potts, MD [address] [place],  [place] [address] PCP: Donetta Potts, MD     Assessment & Plan: Visit Diagnoses:  1. Unilateral primary osteoarthritis, right hip       Plan: We discussed total hip arthroplasty usual spinal anesthesia use of Exparel, Marcaine, postop ambulation with a walker usual overnight stay in the hospital.  Questions were elicited and answered.  Patient understands request we proceed with with right total of arthroplasty.  MRI scan images reviewed I gave her a copy of the report.   Follow-Up Instructions: No follow-ups on file.    Orders:  No orders of the defined types were placed in this encounter.   No orders of the defined types were placed in this encounter.        Procedures: No procedures performed     Clinical Data: No additional findings.     Subjective:     Chief Complaint  Patient presents with   history and physical      06/28/2022 Right THA      HPI patient with continued problems with right hip pain pain in her groin that radiates down to her knee with x-ray showing mild to moderate arthritis.  MRI scan has been obtained on 05/22/2022 which shows more severe osteoarthritis with areas of full cartilage loss.  She has labral tearing anteriorly and superiorly.  Incidental 3 cm uterine fibroid noted which I discussed with her today as well.  She has no problems  with the left hip.  Review of Systems past problems with hemorrhoids.  All other systems noncontributory to HPI.     Objective: Vital Signs: Ht 5\' 1"  (1.549 m)   Wt 138 lb (62.6 kg)   LMP 02/06/2013   BMI 26.07 kg/m    Physical Exam Constitutional:      Appearance: She is well-developed.  HENT:     Head: Normocephalic.     Right Ear: External ear normal.     Left Ear: External ear normal. There is no impacted cerumen.  Eyes:     Pupils: Pupils are equal, round, and reactive to light.  Neck:     Thyroid: No thyromegaly.     Trachea: No tracheal deviation.  Cardiovascular:     Rate and Rhythm: Normal rate.  Pulmonary:     Effort: Pulmonary effort is normal.  Abdominal:     Palpations: Abdomen is soft.  Musculoskeletal:     Cervical back: No rigidity.  Skin:    General: Skin is warm and dry.  Neurological:     Mental Status: She is alert and oriented to person, place, and time.  Psychiatric:        Behavior: Behavior normal.    Ortho Exam sharp pain with right hip internal rotation limited to 15 degrees.  Pain with straight leg raising in the right groin.  Negative logroll left hips.  Reflexes are 2+ symmetrical anterior tib gastrocsoleus is intact and symmetrical.  No sciatic notch tenderness.   Vital signs in last 24 hours: @VSRANGES @  Labs:   Estimated body mass index is 25.24 kg/m as calculated from the following:   Height as of an earlier encounter on 06/28/22: 5\' 2"  (1.575 m).   Weight as of an earlier encounter on 06/28/22: 62.6 kg.   Imaging Review Plain radiographs demonstrate moderate degenerative joint disease of the right hip(s). The bone quality appears to be good for age and reported activity level.      Assessment/Plan:  End stage arthritis, right hip(s)  The patient history, physical examination, clinical judgement of the provider and imaging studies are consistent with end stage degenerative joint disease of the right hip(s) and total hip  arthroplasty is deemed medically necessary. The treatment options including medical management, injection therapy, arthroscopy and arthroplasty were discussed at length. The risks and benefits of total hip arthroplasty were presented and reviewed. The risks due to aseptic loosening, infection, stiffness, dislocation/subluxation,  thromboembolic complications and other imponderables were discussed.  The patient acknowledged the explanation, agreed to proceed with the plan and consent was signed. Patient is being admitted for inpatient treatment for surgery, pain control, PT, OT, prophylactic antibiotics, VTE prophylaxis, progressive ambulation and ADL's and discharge planning.The patient is planning to be discharged home with home health services      Office Visit Note              Patient: REMEDI STANKUS                                     Date of Birth: 14-Jul-1955                                                    MRN: [id number] Visit Date: 06/08/2022  Requested by: Donetta Potts, MD [address] [place],  [place] [address] PCP: Donetta Potts, MD     Assessment & Plan: Visit Diagnoses:  1. Unilateral primary osteoarthritis, right hip       Plan: We discussed total hip arthroplasty usual spinal anesthesia use of Exparel, Marcaine, postop ambulation with a walker usual overnight stay in the hospital.  Questions were elicited and answered.  Patient understands request we proceed with with right total of arthroplasty.  MRI scan images reviewed I gave her a copy of the report.      Patient's anticipated LOS is less than 2 midnights, meeting these requirements: - Younger than 47 - Lives within 1 hour of care - Has a competent adult at home to recover with post-op recover - NO history of  - Chronic pain requiring opiods  - Diabetes  - Coronary Artery Disease  - Heart failure  - Heart attack  - Stroke  - DVT/VTE  -  Cardiac arrhythmia  - Respiratory Failure/COPD  - Renal failure  - Anemia  - Advanced Liver disease

## 2022-06-28 NOTE — Anesthesia Postprocedure Evaluation (Signed)
Anesthesia Post Note  Patient: Courtney Wallace  Procedure(s) Performed: RIGHT TOTAL HIP ARTHROPLASTY ANTERIOR APPROACH (Right: Hip)     Patient location during evaluation: PACU Anesthesia Type: Spinal Level of consciousness: awake and alert Pain management: pain level controlled Vital Signs Assessment: post-procedure vital signs reviewed and stable Respiratory status: spontaneous breathing Cardiovascular status: stable Anesthetic complications: no   No notable events documented.  Last Vitals:  Vitals:   06/28/22 1600 06/28/22 1614  BP: 119/80 125/75  Pulse: 73 66  Resp: 18 17  Temp:    SpO2: 98% 99%    Last Pain:  Vitals:   06/28/22 1631  TempSrc:   PainSc: 7                  Lewie Loron

## 2022-06-28 NOTE — Transfer of Care (Signed)
Immediate Anesthesia Transfer of Care Note  Patient: Courtney Wallace  Procedure(s) Performed: RIGHT TOTAL HIP ARTHROPLASTY ANTERIOR APPROACH (Right: Hip)  Patient Location: PACU  Anesthesia Type:MAC and Spinal  Level of Consciousness: awake  Airway & Oxygen Therapy: Patient Spontanous Breathing  Post-op Assessment: Report given to RN and Post -op Vital signs reviewed and stable  Post vital signs: Reviewed and stable  Last Vitals:  Vitals Value Taken Time  BP 99/59 06/28/22 1436  Temp    Pulse 78 06/28/22 1437  Resp 16 06/28/22 1437  SpO2 91 % 06/28/22 1437  Vitals shown include unvalidated device data.  Last Pain:  Vitals:   06/28/22 1038  TempSrc:   PainSc: 0-No pain         Complications: No notable events documented.

## 2022-06-29 ENCOUNTER — Encounter (HOSPITAL_COMMUNITY): Payer: Self-pay | Admitting: Orthopaedic Surgery

## 2022-06-29 DIAGNOSIS — Z79899 Other long term (current) drug therapy: Secondary | ICD-10-CM | POA: Diagnosis not present

## 2022-06-29 DIAGNOSIS — M1611 Unilateral primary osteoarthritis, right hip: Secondary | ICD-10-CM | POA: Diagnosis not present

## 2022-06-29 LAB — BASIC METABOLIC PANEL
Anion gap: 8 (ref 5–15)
BUN: 9 mg/dL (ref 8–23)
CO2: 24 mmol/L (ref 22–32)
Calcium: 8.8 mg/dL — ABNORMAL LOW (ref 8.9–10.3)
Chloride: 98 mmol/L (ref 98–111)
Creatinine, Ser: 0.61 mg/dL (ref 0.44–1.00)
GFR, Estimated: >60 mL/min (ref >60)
Glucose, Bld: 148 mg/dL — ABNORMAL HIGH (ref 70–99)
Potassium: 4 mmol/L (ref 3.5–5.1)
Sodium: 130 mmol/L — ABNORMAL LOW (ref 135–145)

## 2022-06-29 LAB — CBC
HCT: 31.7 % — ABNORMAL LOW (ref 36.0–46.0)
Hemoglobin: 10.8 g/dL — ABNORMAL LOW (ref 12.0–15.0)
MCH: 30.1 pg (ref 26.0–34.0)
MCHC: 34.1 g/dL (ref 30.0–36.0)
MCV: 88.3 fL (ref 80.0–100.0)
Platelets: 326 10*3/uL (ref 150–400)
RBC: 3.59 MIL/uL — ABNORMAL LOW (ref 3.87–5.11)
RDW: 12.4 % (ref 11.5–15.5)
WBC: 10.5 10*3/uL (ref 4.0–10.5)
nRBC: 0 % (ref 0.0–0.2)

## 2022-06-29 MED ORDER — METHOCARBAMOL 500 MG PO TABS: 500.0000 mg | ORAL_TABLET | Freq: Three times a day (TID) | ORAL | 1 refills | Status: DC | PRN

## 2022-06-29 MED ORDER — HYDROCODONE-ACETAMINOPHEN 5-325 MG PO TABS: 1.0000 | ORAL_TABLET | ORAL | 0 refills | Status: DC | PRN

## 2022-06-29 MED ORDER — ASPIRIN 81 MG PO CHEW: 81.0000 mg | CHEWABLE_TABLET | Freq: Every day | ORAL | Status: DC

## 2022-06-29 NOTE — TOC Transition Note (Signed)
Transition of Care [facility]) - CM/SW Discharge Note   Patient Details  Name: Courtney Wallace MRN: [id number] Date of Birth: 18-Jan-1956  Transition of Care [facility]) CM/SW Contact:  Gordy Clement, RN Phone Number: 06/29/2022, 1:15 PM   Clinical Narrative:     Patient to DC to home with Spouse-  A BSC and RW are recommended and have been requested from [employer]. To be delivered bedside prior to dc. Family to transport. No additional TOC needs           Patient Goals and CMS Choice      Discharge Placement                         Discharge Plan and Services Additional resources added to the After Visit Summary for                                       Social Determinants of Health (SDOH) Interventions SDOH Screenings   Tobacco Use: Low Risk  (06/28/2022)     Readmission Risk Interventions     No data to display

## 2022-06-29 NOTE — Progress Notes (Signed)
Patient ID: Courtney Wallace, female   DOB: [birth year], 67 y.o.   MRN: [id number]   Subjective: 1 Day Post-Op Procedure(s) (LRB): RIGHT TOTAL HIP ARTHROPLASTY ANTERIOR APPROACH (Right) Patient reports pain as mild.   OOB to BR times 3 with assistance.  Objective: Vital signs in last 24 hours: Temp:  [97.5 F (36.4 C)-98.2 F (36.8 C)] 98 F (36.7 C) (05/23 0452) Pulse Rate:  [64-80] 71 (05/23 0452) Resp:  [11-18] 18 (05/23 0452) BP: (97-125)/(50-80) 97/50 (05/23 0452) SpO2:  [92 %-100 %] 94 % (05/23 0452) Weight:  [62.6 kg] 62.6 kg (05/22 1018)  Intake/Output from previous day: 05/22 0701 - 05/23 0700 In: 2430 [P.O.:480; I.V.:1500; IV Piggyback:450] Out: 1450 [Urine:1050; Blood:400] Intake/Output this shift: No intake/output data recorded.  Recent Labs    06/29/22 0200  HGB 10.8*   Recent Labs    06/29/22 0200  WBC 10.5  RBC 3.59*  HCT 31.7*  PLT 326   Recent Labs    06/29/22 0200  NA 130*  K 4.0  CL 98  CO2 24  BUN 9  CREATININE 0.61  GLUCOSE 148*  CALCIUM 8.8*   No results for input(s): "LABPT", "INR" in the last 72 hours.  Neurologically intact DG HIP UNILAT WITH PELVIS 1V RIGHT  Result Date: 06/28/2022 CLINICAL DATA:  Right hip arthroplasty anterior approach. Intraoperative fluoroscopy. EXAM: DG HIP (WITH OR WITHOUT PELVIS) 1V RIGHT COMPARISON:  MRI right hip 05/19/2022 FINDINGS: Images were performed intraoperatively without the presence of a radiologist. Interval total right hip arthroplasty. No hardware complication is seen. Total fluoroscopy images: 3 Total fluoroscopy time: 19 seconds Total dose: Radiation Exposure Index (as provided by the fluoroscopic device): 1.55 mGy air Kerma Please see intraoperative findings for further detail. IMPRESSION: Intraoperative fluoroscopy provided for total right hip arthroplasty. Electronically Signed   By: Neita Garnet M.D.   On: 06/28/2022 14:30   DG C-Arm 1-60 Min-No Report  Result Date: 06/28/2022 Fluoroscopy was  utilized by the requesting physician.  No radiographic interpretation.   DG C-Arm 1-60 Min-No Report  Result Date: 06/28/2022 Fluoroscopy was utilized by the requesting physician.  No radiographic interpretation.    Assessment/Plan: 1 Day Post-Op Procedure(s) (LRB): RIGHT TOTAL HIP ARTHROPLASTY ANTERIOR APPROACH (Right) Up with therapy, discharge today.  Had cortical perforation below lesser troch so ambulation and walker times 6 wks.  No HHPT . Dressing change before discharge. Office one week, has appt next Wed.   Eldred Manges 06/29/2022, 7:28 AM

## 2022-06-29 NOTE — Evaluation (Signed)
Occupational Therapy Evaluation Patient Details Name: Courtney Wallace MRN: [id number] DOB: 09-Jun-1955 Today's Date: 06/29/2022   History of Present Illness 67 y.o. female presents to [facility] hospital on 06/28/2022 for elective R THA. PMH includes LUE mass, HLD, GERD, fatty liver.   Clinical Impression   Patient admitted for the diagnosis and procedure above.  PTA she lived at home with her spouse, and was hampered by R hip discomfort, but remained fairly Ind with ADL and iADL.  Patient states she was using a SPC recently for community mobility.  Currently she is needing supervision for mobility and up to Mod A for lower body ADL.  Hip kit discussed, and patient may choose to purchase.  OT will follow in the acute setting, and no post acute OT is anticipated.  Patient should recover well as pain lessens.       Recommendations for follow up therapy are one component of a multi-disciplinary discharge planning process, led by the attending physician.  Recommendations may be updated based on patient status, additional functional criteria and insurance authorization.   Assistance Recommended at Discharge Intermittent Supervision/Assistance  Patient can return home with the following Assist for transportation;A little help with bathing/dressing/bathroom;Assistance with cooking/housework    Functional Status Assessment  Patient has had a recent decline in their functional status and demonstrates the ability to make significant improvements in function in a reasonable and predictable amount of time.  Equipment Recommendations  None recommended by OT    Recommendations for Other Services       Precautions / Restrictions Precautions Precautions: Fall Precaution Comments: anterior hip, no precautions noted in chart Restrictions Weight Bearing Restrictions: Yes RLE Weight Bearing: Weight bearing as tolerated      Mobility Bed Mobility Overal bed mobility: Needs Assistance Bed Mobility: Supine to Sit,  Sit to Supine     Supine to sit: Min assist, HOB elevated Sit to supine: Min assist, HOB elevated        Transfers Overall transfer level: Needs assistance Equipment used: Rolling walker (2 wheels) Transfers: Sit to/from Stand Sit to Stand: Supervision                  Balance Overall balance assessment: Needs assistance Sitting-balance support: No upper extremity supported, Feet supported Sitting balance-Leahy Scale: Good     Standing balance support: Reliant on assistive device for balance Standing balance-Leahy Scale: Fair                             ADL either performed or assessed with clinical judgement   ADL       Grooming: Wash/dry hands;Supervision/safety;Standing               Lower Body Dressing: Moderate assistance;Sit to/from stand   Toilet Transfer: Supervision/safety;Rolling walker (2 wheels);Regular Toilet;Ambulation                   Vision Patient Visual Report: No change from baseline       Perception     Praxis      Pertinent Vitals/Pain Pain Assessment Pain Assessment: Faces Faces Pain Scale: Hurts little more Pain Location: R hip Pain Descriptors / Indicators: Tender Pain Intervention(s): Monitored during session     Hand Dominance Right   Extremity/Trunk Assessment Upper Extremity Assessment Upper Extremity Assessment: Overall WFL for tasks assessed   Lower Extremity Assessment Lower Extremity Assessment: Defer to PT evaluation       Communication Communication  Communication: No difficulties   Cognition Arousal/Alertness: Awake/alert Behavior During Therapy: WFL for tasks assessed/performed Overall Cognitive Status: Within Functional Limits for tasks assessed                                                        Home Living Family/patient expects to be discharged to:: Private residence Living Arrangements: Spouse/significant other Available Help at Discharge:  Family;Available 24 hours/day;Friend(s) Type of Home: House Home Access: Stairs to enter [employer] of Steps: 3 Entrance Stairs-Rails: Right Home Layout: Able to live on main level with bedroom/bathroom     Bathroom Shower/Tub: Producer, television/film/video: Handicapped height Bathroom Accessibility: Yes   Home Equipment: Agricultural consultant (2 wheels);Rollator (4 wheels);Cane - single point;Grab bars - toilet;Grab bars - tub/shower;Shower seat - built in          Prior Functioning/Environment Prior Level of Function : Independent/Modified Independent;Driving             Mobility Comments: was using a SPC recently ADLs Comments: Mod I with bathing and dressing        OT Problem List: Decreased range of motion;Impaired balance (sitting and/or standing)      OT Treatment/Interventions: Self-care/ADL training;Therapeutic activities;DME and/or AE instruction;Patient/family education    OT Goals(Current goals can be found in the care plan section) Acute Rehab OT Goals Patient Stated Goal: Return home OT Goal Formulation: With patient Time For Goal Achievement: 07/07/22 Potential to Achieve Goals: Good ADL Goals Pt Will Perform Lower Body Dressing: with modified independence;sit to/from stand;with adaptive equipment Pt Will Transfer to Toilet: with modified independence;regular height toilet;ambulating  OT Frequency: Min 2X/week    Co-evaluation              AM-PAC OT "6 Clicks" Daily Activity     Outcome Measure Help from another person eating meals?: None Help from another person taking care of personal grooming?: None Help from another person toileting, which includes using toliet, bedpan, or urinal?: A Little Help from another person bathing (including washing, rinsing, drying)?: A Lot Help from another person to put on and taking off regular upper body clothing?: None Help from another person to put on and taking off regular lower body clothing?:  A Lot 6 Click Score: 19   End of Session Equipment Utilized During Treatment: Rolling walker (2 wheels)  Activity Tolerance: Patient tolerated treatment well Patient left: in bed;with call bell/phone within reach;with family/visitor present  OT Visit Diagnosis: Unsteadiness on feet (R26.81);Pain Pain - Right/Left: Right Pain - part of body: Hip                Time: 0825-0856 OT Time Calculation (min): 31 min Charges:  OT General Charges $OT Visit: 1 Visit OT Evaluation $OT Eval Moderate Complexity: 1 Mod OT Treatments $Self Care/Home Management : 8-22 mins  06/29/2022  RP, OTR/L  Acute Rehabilitation Services  Office:  [phone]   Suzanna Obey 06/29/2022, 9:02 AM

## 2022-06-29 NOTE — Discharge Summary (Signed)
Physician Discharge Summary  Patient ID: Courtney Wallace MRN: [id number] DOB/AGE: Jun 10, 1955 67 y.o.  Admit date: 06/28/2022 Discharge date: 06/29/2022  Admission Diagnoses: Right hip primary osteoarthritis  Discharge Diagnoses: Same Principal Problem:   S/P total right hip arthroplasty   Discharged Condition: good  Hospital Course: Patient was admitted for treatment of her right hip primary osteoarthritis failed conservative treatment.  She underwent right total of arthroplasty direct anterior approach under spinal anesthesia.  Exparel Marcaine injected at the end of her procedure.  IntraOp she had small cortical broach bypass with a size 5 stem.  She is weightbearing as tolerated using a walker x 6 weeks.  Office follow-up 1 week she has appointment next Wednesday.  Hemoglobin was stable.  She was seen by OT and PT.  Patient was ambulating the night of her surgery back and forth to the bathroom.  She worked physical therapy in the hall postop day 1.  Consults: OT and PT.  Significant Diagnostic Studies: PCR was negative.  Hemoglobin preop 12.7 postop 10.8 stable.  Thigh was soft dressing was changed prior to discharge.  Treatments: Right total hip arthroplasty direct anterior approach.  Discharge Exam: Blood pressure (!) 97/50, pulse 71, temperature 98 F (36.7 C), resp. rate 18, height 5\' 2"  (1.575 m), weight 62.6 kg, last menstrual period 02/06/2013, SpO2 94 %. Dressing intact thigh was soft.  Leg lengths equal.  Disposition: Discharge disposition: 01-Home or Self Care        Allergies as of 06/29/2022       Reactions   Penicillins Rash   Has patient had a PCN reaction causing immediate rash, facial/tongue/throat swelling, SOB or lightheadedness with hypotension: Yes Has patient had a PCN reaction causing severe rash involving mucus membranes or skin necrosis: No Has patient had a PCN reaction that required hospitalization No Has patient had a PCN reaction occurring within  the last 10 years: No If all of the above answers are "NO", then may proceed with Cephalosporin use.        Medication List     STOP taking these medications    acetaminophen 500 MG tablet Commonly known as: TYLENOL   baclofen 10 MG tablet Commonly known as: LIORESAL       TAKE these medications    aspirin 81 MG chewable tablet Chew 1 tablet (81 mg total) by mouth daily. Take for 4 wks then stop   CALCIUM PO Take 600 mg by mouth daily.   cholecalciferol 25 MCG (1000 UNIT) tablet Commonly known as: VITAMIN D3 Take 1,000 Units by mouth daily.   gabapentin 300 MG capsule Commonly known as: NEURONTIN Take 300 mg by mouth at bedtime.   HYDROcodone-acetaminophen 5-325 MG tablet Commonly known as: NORCO/VICODIN Take 1-2 tablets by mouth every 4 (four) hours as needed for moderate pain (pain score 4-6).   IRON PO Take 27 mg by mouth daily.   Lysine 1000 MG Tabs Take 1,000 mg by mouth daily.   methocarbamol 500 MG tablet Commonly known as: ROBAXIN Take 1 tablet (500 mg total) by mouth every 8 (eight) hours as needed for muscle spasms.   MULTIVITAMIN PO Take 1 tablet by mouth daily.   Sutab [phone] MG Tabs Generic drug: Sodium Sulfate-Mag Sulfate-KCl Take as directed based on the instructions you received in the office   Vitamin B-12 2500 MCG Subl Place 2,500 mcg under the tongue daily.        Follow-up Information     Eldred Manges, MD Follow up  in 1 week(s).   Specialty: Orthopedic Surgery Contact information: [address] [place] [address] [phone]                 Signed: Eldred Manges 06/29/2022, 7:38 AM

## 2022-06-29 NOTE — Progress Notes (Signed)
Physical Therapy Treatment Patient Details Name: Courtney Wallace MRN: [id number] DOB: [birth year] Today's Date: 06/29/2022   History of Present Illness 67 y.o. female presents to [facility] hospital on 06/28/2022 for elective R THA. PMH includes LUE mass, HLD, GERD, fatty liver.    PT Comments    Pt received sitting in the recliner and agreeable to session with husband present throughout. Pt able to tolerate increased gait distance this session with supervision for safety. Pt and pt's husband asking appropriate question throughout and discussed safely navigating home set up. Discussed importance of HEP exercises for increased quad muscle activation. Anticipate pt and family will be able to manage pt's mobility needs at home.   Recommendations for follow up therapy are one component of a multi-disciplinary discharge planning process, led by the attending physician.  Recommendations may be updated based on patient status, additional functional criteria and insurance authorization.     Assistance Recommended at Discharge Intermittent Supervision/Assistance  Patient can return home with the following A little help with walking and/or transfers;A little help with bathing/dressing/bathroom;Assistance with cooking/housework;Assist for transportation;Help with stairs or ramp for entrance   Equipment Recommendations  BSC/3in1    Recommendations for Other Services       Precautions / Restrictions Precautions Precautions: Fall Precaution Comments: anterior hip, no precautions noted in chart Restrictions Weight Bearing Restrictions: Yes RLE Weight Bearing: Weight bearing as tolerated     Mobility  Bed Mobility Overal bed mobility: Needs Assistance Bed Mobility: Supine to Sit     Supine to sit: Min assist     General bed mobility comments: Pt beginning and ending session in the recliner    Transfers Overall transfer level: Needs assistance Equipment used: Rolling walker (2 wheels) Transfers:  Sit to/from Stand Sit to Stand: Supervision           General transfer comment: From recliner with supervision for safety    Ambulation/Gait Ambulation/Gait assistance: Supervision Gait Distance (Feet): 120 Feet Assistive device: Rolling walker (2 wheels) Gait Pattern/deviations: Step-to pattern, Antalgic, Trunk flexed, Decreased stance time - right Gait velocity: reduced     General Gait Details: Heavy reliance on BUEs to offload RLE         Balance Overall balance assessment: Needs assistance Sitting-balance support: No upper extremity supported, Feet supported Sitting balance-Leahy Scale: Good Sitting balance - Comments: sitting EOB   Standing balance support: Reliant on assistive device for balance, Bilateral upper extremity supported, During functional activity Standing balance-Leahy Scale: Fair Standing balance comment: with RW support                            Cognition Arousal/Alertness: Awake/alert Behavior During Therapy: WFL for tasks assessed/performed Overall Cognitive Status: Within Functional Limits for tasks assessed                                          Exercises      General Comments        Pertinent Vitals/Pain Pain Assessment Pain Assessment: Faces Pain Score: 5  Faces Pain Scale: Hurts even more Pain Location: R hip Pain Descriptors / Indicators: Tender, Aching, Guarding, Grimacing Pain Intervention(s): Monitored during session     PT Goals (current goals can now be found in the care plan section) Acute Rehab PT Goals Patient Stated Goal: to return home, get back on her tractor PT  Goal Formulation: With patient Time For Goal Achievement: 07/02/22 Potential to Achieve Goals: Good Progress towards PT goals: Progressing toward goals    Frequency    7X/week      PT Plan Current plan remains appropriate       AM-PAC PT "6 Clicks" Mobility   Outcome Measure  Help needed turning from your  back to your side while in a flat bed without using bedrails?: A Little Help needed moving from lying on your back to sitting on the side of a flat bed without using bedrails?: A Little Help needed moving to and from a bed to a chair (including a wheelchair)?: A Little Help needed standing up from a chair using your arms (e.g., wheelchair or bedside chair)?: A Little Help needed to walk in hospital room?: A Little Help needed climbing 3-5 steps with a railing? : A Little 6 Click Score: 18    End of Session   Activity Tolerance: Patient tolerated treatment well Patient left: in chair;with family/visitor present;with call bell/phone within reach Nurse Communication: Mobility status PT Visit Diagnosis: Other abnormalities of gait and mobility (R26.89);Muscle weakness (generalized) (M62.81);Pain Pain - Right/Left: Right Pain - part of body: Hip     Time: 1610-9604 PT Time Calculation (min) (ACUTE ONLY): 22 min  Charges:  $Gait Training: 8-22 mins                     Johny Shock, PTA Acute Rehabilitation Services Secure Chat Preferred  [phone]    Johny Shock 06/29/2022, 2:32 PM

## 2022-06-29 NOTE — Care Plan (Signed)
06/26/22- Call to patient and left VM requesting call back. Met with patient in hospital. She is a [facility] Ortho bundle for her Right total hip replacement. She lives with her spouse, who will be able to assist after surgery. She will need a RW and 3in1/BSC recommended by therapy. No anticipated HHPT will be needed. Reviewed post op care instructions. Will continue to follow for needs.

## 2022-06-29 NOTE — Progress Notes (Signed)
Physical Therapy Treatment Patient Details Name: Courtney Wallace MRN: [id number] DOB: [birth year] Today's Date: 06/29/2022   History of Present Illness 67 y.o. female presents to [facility] hospital on 06/28/2022 for elective R THA. PMH includes LUE mass, HLD, GERD, fatty liver.    PT Comments    Pt received in supine and agreeable to session with husband present throughout. Pt requiring min A for bed mobility in a flat bed without use of handrail. Pt able to stand and tolerate increased gait distance with cues for upright posture. Pt performing 2 safe stair trials with min guard for safety and husband present for education on safe guarding. Pt requesting second session this afternoon before discharge. Pt continues to benefit from PT services to progress toward functional mobility goals.     Recommendations for follow up therapy are one component of a multi-disciplinary discharge planning process, led by the attending physician.  Recommendations may be updated based on patient status, additional functional criteria and insurance authorization.     Assistance Recommended at Discharge Intermittent Supervision/Assistance  Patient can return home with the following A little help with walking and/or transfers;A little help with bathing/dressing/bathroom;Assistance with cooking/housework;Assist for transportation;Help with stairs or ramp for entrance   Equipment Recommendations  BSC/3in1    Recommendations for Other Services       Precautions / Restrictions Precautions Precautions: Fall Precaution Comments: anterior hip, no precautions noted in chart Restrictions Weight Bearing Restrictions: Yes RLE Weight Bearing: Weight bearing as tolerated     Mobility  Bed Mobility Overal bed mobility: Needs Assistance Bed Mobility: Supine to Sit     Supine to sit: Min assist     General bed mobility comments: Pt requiring min A with HOB flat and no use of handrails. HHA for trunk elevation     Transfers Overall transfer level: Needs assistance Equipment used: Rolling walker (2 wheels) Transfers: Sit to/from Stand Sit to Stand: Supervision           General transfer comment: From EOB and recliner with cues for hand placement    Ambulation/Gait Ambulation/Gait assistance: Min guard, Supervision Gait Distance (Feet): 75 Feet Assistive device: Rolling walker (2 wheels) Gait Pattern/deviations: Step-to pattern, Step-through pattern, Antalgic, Trunk flexed, Decreased stance time - right Gait velocity: reduced     General Gait Details: Slow step-to gait progressing to step-through. Cues for upright posture and forward gaze.   Stairs Stairs: Yes Stairs assistance: Min guard Stair Management: One rail Right, Sideways, Backwards Number of Stairs: 3 (+4) General stair comments: Pt instructed in stair techniques and pt able to demonstrate. No overt LOB or buckling.        Balance Overall balance assessment: Needs assistance Sitting-balance support: No upper extremity supported, Feet supported Sitting balance-Leahy Scale: Good Sitting balance - Comments: sitting EOB   Standing balance support: Reliant on assistive device for balance, Bilateral upper extremity supported, During functional activity Standing balance-Leahy Scale: Fair Standing balance comment: with RW support                            Cognition Arousal/Alertness: Awake/alert Behavior During Therapy: WFL for tasks assessed/performed Overall Cognitive Status: Within Functional Limits for tasks assessed                                          Exercises  General Comments        Pertinent Vitals/Pain Pain Assessment Pain Assessment: 0-10 Pain Score: 5  Pain Location: R hip Pain Descriptors / Indicators: Tender, Aching, Guarding, Grimacing Pain Intervention(s): Monitored during session     PT Goals (current goals can now be found in the care plan section)  Acute Rehab PT Goals Patient Stated Goal: to return home, get back on her tractor PT Goal Formulation: With patient Time For Goal Achievement: 07/02/22 Potential to Achieve Goals: Good Progress towards PT goals: Progressing toward goals    Frequency    7X/week      PT Plan Current plan remains appropriate       AM-PAC PT "6 Clicks" Mobility   Outcome Measure  Help needed turning from your back to your side while in a flat bed without using bedrails?: A Little Help needed moving from lying on your back to sitting on the side of a flat bed without using bedrails?: A Little Help needed moving to and from a bed to a chair (including a wheelchair)?: A Little Help needed standing up from a chair using your arms (e.g., wheelchair or bedside chair)?: A Little Help needed to walk in hospital room?: A Little Help needed climbing 3-5 steps with a railing? : A Little 6 Click Score: 18    End of Session Equipment Utilized During Treatment: Gait belt Activity Tolerance: Patient tolerated treatment well Patient left: in chair;with family/visitor present;with call bell/phone within reach Nurse Communication: Mobility status PT Visit Diagnosis: Other abnormalities of gait and mobility (R26.89);Muscle weakness (generalized) (M62.81);Pain Pain - Right/Left: Right Pain - part of body: Hip     Time: 0929-0959 PT Time Calculation (min) (ACUTE ONLY): 30 min  Charges:  $Gait Training: 23-37 mins                     Johny Shock, PTA Acute Rehabilitation Services Secure Chat Preferred  [phone]    Johny Shock 06/29/2022, 10:10 AM

## 2022-06-30 ENCOUNTER — Telehealth: Payer: Self-pay | Admitting: *Deleted

## 2022-06-30 NOTE — Telephone Encounter (Signed)
Patient discharged from hospital yesterday after Right total hip done by Dr. Ophelia Charter on 06/28/22. D/C Ortho bundle call- state she is having a hard day today. Difficulty getting in/out of bed. Encouraged sitting in recliner if she feels she needs to. Also pumping feet/ankles and staying hydrated. She states she wil. No new needs currently. Will continue to follow.

## 2022-07-05 ENCOUNTER — Other Ambulatory Visit: Payer: Self-pay

## 2022-07-05 ENCOUNTER — Encounter: Payer: Self-pay | Admitting: Orthopaedic Surgery

## 2022-07-05 ENCOUNTER — Ambulatory Visit (INDEPENDENT_AMBULATORY_CARE_PROVIDER_SITE_OTHER): Payer: Medicare Other | Admitting: Orthopaedic Surgery

## 2022-07-05 VITALS — Ht 62.0 in | Wt 138.0 lb

## 2022-07-05 DIAGNOSIS — Z96641 Presence of right artificial hip joint: Secondary | ICD-10-CM | POA: Diagnosis not present

## 2022-07-05 MED ORDER — HYDROCODONE-ACETAMINOPHEN 5-325 MG PO TABS: 1.0000 | ORAL_TABLET | ORAL | 0 refills | Status: DC | PRN

## 2022-07-05 NOTE — Progress Notes (Signed)
Post-Op Visit Note   Patient: Courtney Wallace           Date of Birth: 04-21-1955           MRN: [id number] Visit Date: 07/05/2022 PCP: Donetta Potts, MD   Assessment & Plan: Follow-up right total hip arthroplasty.  IntraOp posterior lateral canal fenestration with early broach bypassed with her stem.  Not visualized on x-ray today other than teeny bit of the bone graft which was pushed up to the hole.  Thigh is soft no drainage on the incision.  She has not gotten her walker apparently the one at the hospital was too tall he did not try Junior walker .  Norco renewed.  Prescription given for folding walker with 5 inch front wheels.  Tennis balls in the back.  In 1 week.  Chief Complaint:  Chief Complaint  Patient presents with   Right Hip - Routine Post Op    06/28/2022 Right THA   Visit Diagnoses:  1. S/P total right hip arthroplasty     Plan: Return in 1 week for staple removal.  Prescription given for folding walker 5 inch front wheels and he can go by [employer] in [place].  Follow-Up Instructions: No follow-ups on file.   Orders:  Orders Placed This Encounter  Procedures   XR HIP UNILAT W OR W/O PELVIS 2-3 VIEWS RIGHT   No orders of the defined types were placed in this encounter.   Imaging: No results found.  PMFS History: Patient Active Problem List   Diagnosis Date Noted   S/P total right hip arthroplasty 06/28/2022   Unilateral primary osteoarthritis, right hip 05/12/2022   Screening for colon cancer 02/07/2022   Benign neoplasm of transverse colon 02/07/2022   Hemorrhoids without complication 02/07/2022   Past Medical History:  Diagnosis Date   Arm mass, left    Bursitis    right hip   Dyslipidemia    Fatty liver    GERD (gastroesophageal reflux disease)    pt denies   Osteoarthritis, hip, bilateral    Prediabetes    Right hip pain    bursitis    Family History  Problem Relation Age of Onset   Dementia Mother    Melanoma Mother     Cirrhosis Father    Breast cancer Sister    Neuropathy Neg Hx     Past Surgical History:  Procedure Laterality Date   COLONOSCOPY  08/30/2010   Procedure: COLONOSCOPY;  Surgeon: Dalia Heading;  [facility];  Service: Gastroenterology;  Laterality: N/A;   COLONOSCOPY WITH PROPOFOL N/A 02/07/2022   Procedure: COLONOSCOPY WITH PROPOFOL;  Surgeon: Lewie Chamber, DO;  Location: [facility];  Service: General;  Laterality: N/A;   DILATION AND CURETTAGE OF UTERUS  yrs ago   MASS EXCISION Left 10/02/2019   Procedure: EXCISION LEFT ARM SUBCUTANEOUS MASS;  Surgeon: Kinsinger, De Blanch, MD;  Location: [facility];  Service: General;  Laterality: Left;   POLYPECTOMY  02/07/2022   Procedure: POLYPECTOMY;  Surgeon: Lewie Chamber, DO;  [facility]: [facility];  Service: General;;   TONSILLECTOMY  age 8   TOTAL HIP ARTHROPLASTY Right 06/28/2022   Procedure: RIGHT TOTAL HIP ARTHROPLASTY ANTERIOR APPROACH;  Surgeon: Eldred Manges, MD;  Location: [facility];  Service: Orthopedics;  Laterality: Right;  Needs RNFA   TUBAL LIGATION     Social History   Occupational History   Not on file  Tobacco Use  Smoking status: Never   Smokeless tobacco: Never  Vaping Use   Vaping Use: Never used  Substance and Sexual Activity   Alcohol use: No   Drug use: No   Sexual activity: Not on file

## 2022-07-12 ENCOUNTER — Ambulatory Visit (INDEPENDENT_AMBULATORY_CARE_PROVIDER_SITE_OTHER): Payer: Medicare Other | Admitting: Orthopaedic Surgery

## 2022-07-12 ENCOUNTER — Encounter: Payer: Self-pay | Admitting: Orthopaedic Surgery

## 2022-07-12 VITALS — Ht 62.0 in | Wt 138.0 lb

## 2022-07-12 DIAGNOSIS — M1611 Unilateral primary osteoarthritis, right hip: Secondary | ICD-10-CM

## 2022-07-12 DIAGNOSIS — Z96641 Presence of right artificial hip joint: Secondary | ICD-10-CM

## 2022-07-12 MED ORDER — HYDROCODONE-ACETAMINOPHEN 5-325 MG PO TABS: 1.0000 | ORAL_TABLET | ORAL | 0 refills | Status: DC | PRN

## 2022-07-12 NOTE — Progress Notes (Signed)
Post-Op Visit Note   Patient: Courtney Wallace           Date of Birth: Jun 26, 1955           MRN: [id number] Visit Date: 07/12/2022  PCP: Donetta Potts, MD   Assessment & Plan: Follow-up right total hip arthroplasty.  Staples harvested.  Hydrocodone refilled.  Leg lengths are equal previous x-rays show good position alignment no falls.  Continue ambulation recheck 5 weeks.  Chief Complaint:  Chief Complaint  Patient presents with   Right Hip - Routine Post Op, Follow-up    06/28/2022 Right THA   Visit Diagnoses:  1. Unilateral primary osteoarthritis, right hip   2. S/P total right hip arthroplasty     Plan: Recheck 5 weeks no x-ray needed on return.  Continue progressive walking.  Follow-Up Instructions: Return in about 5 weeks (around 08/16/2022).   Orders:  No orders of the defined types were placed in this encounter.  Meds ordered this encounter  Medications   HYDROcodone-acetaminophen (NORCO/VICODIN) 5-325 MG tablet    Sig: Take 1-2 tablets by mouth every 4 (four) hours as needed for moderate pain (pain score 4-6).    Dispense:  45 tablet    Refill:  0    Post op pain    Imaging: No results found.  PMFS History: Patient Active Problem List   Diagnosis Date Noted   S/P total right hip arthroplasty 06/28/2022   Unilateral primary osteoarthritis, right hip 05/12/2022   Screening for colon cancer 02/07/2022   Benign neoplasm of transverse colon 02/07/2022   Hemorrhoids without complication 02/07/2022   Past Medical History:  Diagnosis Date   Arm mass, left    Bursitis    right hip   Dyslipidemia    Fatty liver    GERD (gastroesophageal reflux disease)    pt denies   Osteoarthritis, hip, bilateral    Prediabetes    Right hip pain    bursitis    Family History  Problem Relation Age of Onset   Dementia Mother    Melanoma Mother    Cirrhosis Father    Breast cancer Sister    Neuropathy Neg Hx     Past Surgical History:  Procedure Laterality Date    COLONOSCOPY  08/30/2010   Procedure: COLONOSCOPY;  Surgeon: Dalia Heading;  [facility];  Service: Gastroenterology;  Laterality: N/A;   COLONOSCOPY WITH PROPOFOL N/A 02/07/2022   Procedure: COLONOSCOPY WITH PROPOFOL;  Surgeon: Lewie Chamber, DO;  [facility];  Service: General;  Laterality: N/A;   DILATION AND CURETTAGE OF UTERUS  yrs ago   MASS EXCISION Left 10/02/2019   Procedure: EXCISION LEFT ARM SUBCUTANEOUS MASS;  Surgeon: Kinsinger, De Blanch, MD;  Location: [facility];  Service: General;  Laterality: Left;   POLYPECTOMY  02/07/2022   Procedure: POLYPECTOMY;  Surgeon: Lewie Chamber, DO;  [facility];  Service: General;;   TONSILLECTOMY  age 14   TOTAL HIP ARTHROPLASTY Right 06/28/2022   Procedure: RIGHT TOTAL HIP ARTHROPLASTY ANTERIOR APPROACH;  Surgeon: Eldred Manges, MD;  Location: [facility];  Service: Orthopedics;  Laterality: Right;  Needs RNFA   TUBAL LIGATION     Social History   Occupational History   Not on file  Tobacco Use   Smoking status: Never   Smokeless tobacco: Never  Vaping Use   Vaping Use: Never used  Substance and Sexual Activity   Alcohol use: No  Drug use: No   Sexual activity: Not on file

## 2022-07-14 ENCOUNTER — Telehealth: Payer: Self-pay | Admitting: *Deleted

## 2022-07-14 NOTE — Telephone Encounter (Signed)
Ortho bundle call . 

## 2022-07-20 ENCOUNTER — Telehealth: Payer: Self-pay | Admitting: Orthopaedic Surgery

## 2022-07-20 ENCOUNTER — Other Ambulatory Visit: Payer: Self-pay | Admitting: Orthopaedic Surgery

## 2022-07-20 NOTE — Telephone Encounter (Signed)
Patient called needing Rx refilled Hydrocodone. The number to contact patient is [phone]

## 2022-07-20 NOTE — Telephone Encounter (Signed)
Refill was sent in by Dr. Steward Drone today. I left voicemail for patient advising.

## 2022-07-21 DIAGNOSIS — Z1231 Encounter for screening mammogram for malignant neoplasm of breast: Secondary | ICD-10-CM | POA: Diagnosis not present

## 2022-07-25 DIAGNOSIS — R748 Abnormal levels of other serum enzymes: Secondary | ICD-10-CM | POA: Diagnosis not present

## 2022-07-25 DIAGNOSIS — R7303 Prediabetes: Secondary | ICD-10-CM | POA: Diagnosis not present

## 2022-07-25 DIAGNOSIS — Z13220 Encounter for screening for lipoid disorders: Secondary | ICD-10-CM | POA: Diagnosis not present

## 2022-07-28 ENCOUNTER — Other Ambulatory Visit: Payer: Self-pay | Admitting: Orthopaedic Surgery

## 2022-07-28 ENCOUNTER — Telehealth: Payer: Self-pay | Admitting: Radiology

## 2022-07-28 NOTE — Telephone Encounter (Signed)
Patient requests refill on hydrocodone be sent to [facility]. Please advise.

## 2022-08-01 DIAGNOSIS — R5383 Other fatigue: Secondary | ICD-10-CM | POA: Diagnosis not present

## 2022-08-01 DIAGNOSIS — Z6823 Body mass index (BMI) 23.0-23.9, adult: Secondary | ICD-10-CM | POA: Diagnosis not present

## 2022-08-01 DIAGNOSIS — R200 Anesthesia of skin: Secondary | ICD-10-CM | POA: Diagnosis not present

## 2022-08-01 DIAGNOSIS — M25559 Pain in unspecified hip: Secondary | ICD-10-CM | POA: Diagnosis not present

## 2022-08-01 DIAGNOSIS — R7303 Prediabetes: Secondary | ICD-10-CM | POA: Diagnosis not present

## 2022-08-01 DIAGNOSIS — R030 Elevated blood-pressure reading, without diagnosis of hypertension: Secondary | ICD-10-CM | POA: Diagnosis not present

## 2022-08-01 DIAGNOSIS — M549 Dorsalgia, unspecified: Secondary | ICD-10-CM | POA: Diagnosis not present

## 2022-08-03 ENCOUNTER — Telehealth: Payer: Self-pay | Admitting: *Deleted

## 2022-08-03 NOTE — Telephone Encounter (Signed)
Courtney Wallace from [facility] called wanted to make sure we received a copy of the ultrasound of her liver. Yes it is in the chart.

## 2022-08-07 ENCOUNTER — Other Ambulatory Visit: Payer: Self-pay | Admitting: *Deleted

## 2022-08-07 DIAGNOSIS — K760 Fatty (change of) liver, not elsewhere classified: Secondary | ICD-10-CM

## 2022-08-09 NOTE — Telephone Encounter (Signed)
Noted. Labs entered into Epic  °

## 2022-08-16 ENCOUNTER — Ambulatory Visit (INDEPENDENT_AMBULATORY_CARE_PROVIDER_SITE_OTHER): Payer: Medicare Other | Admitting: Orthopaedic Surgery

## 2022-08-16 ENCOUNTER — Encounter: Payer: Self-pay | Admitting: Orthopaedic Surgery

## 2022-08-16 DIAGNOSIS — Z96641 Presence of right artificial hip joint: Secondary | ICD-10-CM

## 2022-08-16 NOTE — Progress Notes (Signed)
   Post-Op Visit Note   Patient: Courtney Wallace           Date of Birth: 1955-05-24           MRN: [id number] Visit Date: 08/16/2022 PCP: Donetta Potts, MD   Assessment & Plan: Postop right total hip arthroplasty.  Her undergarments sometimes rubs and pinches against portion incision we discussed wearing some different style that are looser for period time.  She is amatory without limp at the end of the day she notices slight soreness we discussed hip abduction strengthening exercises laying on her opposite hip on her side.  She is happy the surgical result leg lengths are equal.  Chief Complaint:  Chief Complaint  Patient presents with   Right Hip - Routine Post Op    Doing well, tylenol arthritis prn only, most days is ok.  Stiff and achy at times.    Visit Diagnoses:  1. S/P total right hip arthroplasty     Plan: Return as needed.  Follow-Up Instructions: No follow-ups on file.   Orders:  No orders of the defined types were placed in this encounter.  No orders of the defined types were placed in this encounter.   Imaging: No results found.  PMFS History: Patient Active Problem List   Diagnosis Date Noted   S/P total right hip arthroplasty 06/28/2022   Screening for colon cancer 02/07/2022   Benign neoplasm of transverse colon 02/07/2022   Hemorrhoids without complication 02/07/2022   Past Medical History:  Diagnosis Date   Arm mass, left    Bursitis    right hip   Dyslipidemia    Fatty liver    GERD (gastroesophageal reflux disease)    pt denies   Osteoarthritis, hip, bilateral    Prediabetes    Right hip pain    bursitis    Family History  Problem Relation Age of Onset   Dementia Mother    Melanoma Mother    Cirrhosis Father    Breast cancer Sister    Neuropathy Neg Hx     Past Surgical History:  Procedure Laterality Date   COLONOSCOPY  08/30/2010   Procedure: COLONOSCOPY;  Surgeon: Dalia Heading;  [facility];  Service:  Gastroenterology;  Laterality: N/A;   COLONOSCOPY WITH PROPOFOL N/A 02/07/2022   Procedure: COLONOSCOPY WITH PROPOFOL;  Surgeon: Lewie Chamber, DO;  [facility];  Service: General;  Laterality: N/A;   DILATION AND CURETTAGE OF UTERUS  yrs ago   MASS EXCISION Left 10/02/2019   Procedure: EXCISION LEFT ARM SUBCUTANEOUS MASS;  Surgeon: Kinsinger, De Blanch, MD;  Location: [facility];  Service: General;  Laterality: Left;   POLYPECTOMY  02/07/2022   Procedure: POLYPECTOMY;  Surgeon: Lewie Chamber, DO;  [facility];  Service: General;;   TONSILLECTOMY  age 58   TOTAL HIP ARTHROPLASTY Right 06/28/2022   Procedure: RIGHT TOTAL HIP ARTHROPLASTY ANTERIOR APPROACH;  Surgeon: Eldred Manges, MD;  Location: [facility];  Service: Orthopedics;  Laterality: Right;  Needs RNFA   TUBAL LIGATION     Social History   Occupational History   Not on file  Tobacco Use   Smoking status: Never   Smokeless tobacco: Never  Vaping Use   Vaping Use: Never used  Substance and Sexual Activity   Alcohol use: No   Drug use: No   Sexual activity: Not on file

## 2022-09-13 ENCOUNTER — Telehealth: Payer: Self-pay | Admitting: Orthopaedic Surgery

## 2022-09-13 NOTE — Telephone Encounter (Signed)
Mandy from Dr. Sueanne Margarita office needing protocal for premeds for cleanings. If she can take it or not. [phone]

## 2022-09-13 NOTE — Telephone Encounter (Signed)
Faxed as requested

## 2022-09-13 NOTE — Telephone Encounter (Signed)
I called Courtney Wallace and advised no premed needed. She would like me to fax note to [phone].

## 2022-11-20 ENCOUNTER — Encounter: Payer: Self-pay | Admitting: Gastroenterology

## 2022-11-29 ENCOUNTER — Other Ambulatory Visit: Payer: Self-pay | Admitting: Orthopaedic Surgery

## 2022-11-29 ENCOUNTER — Telehealth: Payer: Self-pay | Admitting: Radiology

## 2022-11-29 MED ORDER — METHOCARBAMOL 500 MG PO TABS: 500.0000 mg | ORAL_TABLET | Freq: Three times a day (TID) | ORAL | 1 refills | Status: DC | PRN

## 2022-11-29 NOTE — Telephone Encounter (Signed)
Pt would like a refill of Robaxin. She says that is helps with her back when it goes out   She can be reached at [phone]

## 2022-11-30 NOTE — Telephone Encounter (Signed)
I called and advised. 

## 2022-12-05 ENCOUNTER — Telehealth: Payer: Self-pay | Admitting: *Deleted

## 2022-12-05 ENCOUNTER — Other Ambulatory Visit: Payer: Self-pay | Admitting: *Deleted

## 2022-12-05 DIAGNOSIS — K760 Fatty (change of) liver, not elsewhere classified: Secondary | ICD-10-CM

## 2022-12-05 NOTE — Telephone Encounter (Signed)
Spoke to pt, informed her I mailed lab requisitions. She voiced understanding.

## 2022-12-08 DIAGNOSIS — K760 Fatty (change of) liver, not elsewhere classified: Secondary | ICD-10-CM | POA: Diagnosis not present

## 2022-12-09 LAB — COMPREHENSIVE METABOLIC PANEL
ALT: 40 [IU]/L — ABNORMAL HIGH (ref 0–32)
AST: 29 [IU]/L (ref 0–40)
Albumin: 4.3 g/dL (ref 3.9–4.9)
Alkaline Phosphatase: 100 [IU]/L (ref 44–121)
BUN/Creatinine Ratio: 19 (ref 12–28)
BUN: 19 mg/dL (ref 8–27)
Bilirubin Total: <0.2 mg/dL (ref 0.0–1.2)
CO2: 27 mmol/L (ref 20–29)
Calcium: 9.6 mg/dL (ref 8.7–10.3)
Chloride: 103 mmol/L (ref 96–106)
Creatinine, Ser: 1.02 mg/dL — ABNORMAL HIGH (ref 0.57–1.00)
Globulin, Total: 2.6 g/dL (ref 1.5–4.5)
Glucose: 100 mg/dL — ABNORMAL HIGH (ref 70–99)
Potassium: 4.3 mmol/L (ref 3.5–5.2)
Sodium: 142 mmol/L (ref 134–144)
Total Protein: 6.9 g/dL (ref 6.0–8.5)
eGFR: 60 mL/min/{1.73_m2} (ref >59)

## 2022-12-09 LAB — LIPID PANEL
Chol/HDL Ratio: 3.8 ratio (ref 0.0–4.4)
Cholesterol, Total: 261 mg/dL — ABNORMAL HIGH (ref 100–199)
HDL: 69 mg/dL (ref >39)
LDL Chol Calc (NIH): 132 mg/dL — ABNORMAL HIGH (ref 0–99)
Triglycerides: 337 mg/dL — ABNORMAL HIGH (ref 0–149)
VLDL Cholesterol Cal: 60 mg/dL — ABNORMAL HIGH (ref 5–40)

## 2022-12-13 ENCOUNTER — Other Ambulatory Visit: Payer: Self-pay | Admitting: *Deleted

## 2022-12-13 DIAGNOSIS — L821 Other seborrheic keratosis: Secondary | ICD-10-CM | POA: Diagnosis not present

## 2022-12-13 DIAGNOSIS — D225 Melanocytic nevi of trunk: Secondary | ICD-10-CM | POA: Diagnosis not present

## 2022-12-13 DIAGNOSIS — L578 Other skin changes due to chronic exposure to nonionizing radiation: Secondary | ICD-10-CM | POA: Diagnosis not present

## 2022-12-13 DIAGNOSIS — Z808 Family history of malignant neoplasm of other organs or systems: Secondary | ICD-10-CM | POA: Diagnosis not present

## 2022-12-13 DIAGNOSIS — K760 Fatty (change of) liver, not elsewhere classified: Secondary | ICD-10-CM

## 2022-12-13 DIAGNOSIS — L570 Actinic keratosis: Secondary | ICD-10-CM | POA: Diagnosis not present

## 2022-12-14 ENCOUNTER — Other Ambulatory Visit: Payer: Self-pay | Admitting: *Deleted

## 2022-12-14 DIAGNOSIS — K760 Fatty (change of) liver, not elsewhere classified: Secondary | ICD-10-CM

## 2022-12-14 DIAGNOSIS — R7989 Other specified abnormal findings of blood chemistry: Secondary | ICD-10-CM

## 2022-12-21 ENCOUNTER — Ambulatory Visit (HOSPITAL_COMMUNITY)
Admission: RE | Admit: 2022-12-21 | Discharge: 2022-12-21 | Disposition: A | Discharge status: Home / Self Care | Payer: Medicare Other | Source: Ambulatory Visit | Attending: Gastroenterology | Admitting: Gastroenterology

## 2022-12-21 DIAGNOSIS — K760 Fatty (change of) liver, not elsewhere classified: Secondary | ICD-10-CM | POA: Diagnosis not present

## 2022-12-21 DIAGNOSIS — R7989 Other specified abnormal findings of blood chemistry: Secondary | ICD-10-CM | POA: Insufficient documentation

## 2023-01-01 DIAGNOSIS — R718 Other abnormality of red blood cells: Secondary | ICD-10-CM | POA: Diagnosis not present

## 2023-01-01 DIAGNOSIS — R234 Changes in skin texture: Secondary | ICD-10-CM | POA: Diagnosis not present

## 2023-01-01 DIAGNOSIS — D582 Other hemoglobinopathies: Secondary | ICD-10-CM | POA: Diagnosis not present

## 2023-01-16 DIAGNOSIS — E559 Vitamin D deficiency, unspecified: Secondary | ICD-10-CM | POA: Diagnosis not present

## 2023-01-16 DIAGNOSIS — E039 Hypothyroidism, unspecified: Secondary | ICD-10-CM | POA: Diagnosis not present

## 2023-01-16 DIAGNOSIS — R739 Hyperglycemia, unspecified: Secondary | ICD-10-CM | POA: Diagnosis not present

## 2023-01-16 DIAGNOSIS — E7849 Other hyperlipidemia: Secondary | ICD-10-CM | POA: Diagnosis not present

## 2023-01-23 DIAGNOSIS — M25559 Pain in unspecified hip: Secondary | ICD-10-CM | POA: Diagnosis not present

## 2023-01-23 DIAGNOSIS — R5383 Other fatigue: Secondary | ICD-10-CM | POA: Diagnosis not present

## 2023-01-23 DIAGNOSIS — Z0001 Encounter for general adult medical examination with abnormal findings: Secondary | ICD-10-CM | POA: Diagnosis not present

## 2023-01-23 DIAGNOSIS — Z1389 Encounter for screening for other disorder: Secondary | ICD-10-CM | POA: Diagnosis not present

## 2023-01-23 DIAGNOSIS — R7303 Prediabetes: Secondary | ICD-10-CM | POA: Diagnosis not present

## 2023-01-23 DIAGNOSIS — R030 Elevated blood-pressure reading, without diagnosis of hypertension: Secondary | ICD-10-CM | POA: Diagnosis not present

## 2023-01-23 DIAGNOSIS — Z6824 Body mass index (BMI) 24.0-24.9, adult: Secondary | ICD-10-CM | POA: Diagnosis not present

## 2023-01-23 DIAGNOSIS — R200 Anesthesia of skin: Secondary | ICD-10-CM | POA: Diagnosis not present

## 2023-01-23 DIAGNOSIS — M549 Dorsalgia, unspecified: Secondary | ICD-10-CM | POA: Diagnosis not present

## 2023-01-29 DIAGNOSIS — Z0000 Encounter for general adult medical examination without abnormal findings: Secondary | ICD-10-CM | POA: Diagnosis not present

## 2023-04-12 DIAGNOSIS — C44712 Basal cell carcinoma of skin of right lower limb, including hip: Secondary | ICD-10-CM | POA: Diagnosis not present

## 2023-04-12 DIAGNOSIS — D485 Neoplasm of uncertain behavior of skin: Secondary | ICD-10-CM | POA: Diagnosis not present

## 2023-04-12 DIAGNOSIS — B079 Viral wart, unspecified: Secondary | ICD-10-CM | POA: Diagnosis not present

## 2023-04-27 DIAGNOSIS — C44712 Basal cell carcinoma of skin of right lower limb, including hip: Secondary | ICD-10-CM | POA: Diagnosis not present

## 2023-05-08 DIAGNOSIS — Z5189 Encounter for other specified aftercare: Secondary | ICD-10-CM | POA: Diagnosis not present

## 2023-05-23 ENCOUNTER — Other Ambulatory Visit (INDEPENDENT_AMBULATORY_CARE_PROVIDER_SITE_OTHER)

## 2023-05-23 ENCOUNTER — Ambulatory Visit: Admitting: Orthopaedic Surgery

## 2023-05-23 DIAGNOSIS — M25552 Pain in left hip: Secondary | ICD-10-CM

## 2023-05-23 DIAGNOSIS — M1612 Unilateral primary osteoarthritis, left hip: Secondary | ICD-10-CM | POA: Diagnosis not present

## 2023-05-23 NOTE — Progress Notes (Signed)
 The patient is a 68 year old female that I am seeing for the first time that she is a patient of Dr. Murrel Arnt did replaced her left hip in May 2024 secondary to significant arthritis of that hip.  She now has significant left hip and groin pain.  This is getting worse for her and she is walking with a limp.  She did have an MRI a year ago that showed both of her hips.  The right hip had severe end-stage arthritis.  I did look at the MRI of her left hip and there were cystic changes in both the femoral head and acetabulum showing at least moderate arthritis at the time.  She is now having worsening left hip pain and is doing very well with the right hip replacement.  When I have her lay in a supine position her leg lengths are equal.  When I put her right hip through range of motion it shows no pain at all and no blocks or rotation.  However her left hip has significant pain in the groin with internal and external rotation and the pain is quite severe.  An AP pelvis today and lateral of the left hip shows joint space narrowing that is getting more significant.  There are cystic changes in the femoral head and acetabulum as well as peritracheal osteophyte along the lateral femoral head neck.  Again the MRI from last year showed significant arthritis in her left hip but it was quite severe on the right side.  This point I want her ambulating with a cane in her right hand to offload her left hip.  I am concerned about her falling giving the mount of pain she is having and the limp she has.  She has our surgery scheduler's card because she wants to wait to see what is going on with her husband's spine and whether or not he will need anything before she is scheduled for a left hip replacement.  Having had this before in her right side she is fully aware of the risk and benefits of this type of surgery and what to expect from an intraoperative and postoperative standpoint.  I am happy to get her scheduled for left total  hip arthroplasty as soon as she will let us  know.  All questions and concerns were addressed and answered.

## 2023-06-06 ENCOUNTER — Other Ambulatory Visit: Payer: Self-pay | Admitting: *Deleted

## 2023-06-06 DIAGNOSIS — K760 Fatty (change of) liver, not elsewhere classified: Secondary | ICD-10-CM

## 2023-07-13 NOTE — Progress Notes (Signed)
 Sent message, via epic in basket, requesting orders in epic from [facility].

## 2023-07-16 NOTE — Progress Notes (Signed)
 COVID Vaccine Completed:  Date of COVID positive in last 35 days:no  PCP - Armen Land, MD Cardiologist - n/a  Chest x-ray - n/a EKG - n/a Stress Test - n/a ECHO - n/a Cardiac Cath - n/a Pacemaker/ICD device last checked: n/a Spinal Cord Stimulator: n/a  Bowel Prep - no  Sleep Study - n/a CPAP -   Fasting Blood Sugar - preDM, pt deneis Checks Blood Sugar _____ times a day  Last dose of GLP1 agonist-  N/A GLP1 instructions:  Hold 7 days before surgery    Last dose of SGLT-2 inhibitors-  N/A SGLT-2 instructions:  Hold 3 days before surgery    Blood Thinner Instructions:  Last dose: n/a  Time: Aspirin  Instructions:  Last Dose:  Activity level: Can go up a flight of stairs and perform activities of daily living without stopping and without symptoms of chest pain or shortness of breath. Slow with stairs. Has walker to use PRN   Anesthesia review:   Patient denies shortness of breath, fever, cough and chest pain at PAT appointment  Patient verbalized understanding of instructions that were given to them at the PAT appointment. Patient was also instructed that they will need to review over the PAT instructions again at home before surgery.

## 2023-07-16 NOTE — Patient Instructions (Signed)
 SURGICAL WAITING ROOM VISITATION  Patients having surgery or a procedure may have no more than 2 support people in the waiting area - these visitors may rotate.    Children under the age of 39 must have an adult with them who is not the patient.  Visitors with respiratory illnesses are discouraged from visiting and should remain at home.  If the patient needs to stay at the hospital during part of their recovery, the visitor guidelines for inpatient rooms apply. Pre-op nurse will coordinate an appropriate time for 1 support person to accompany patient in pre-op.  This support person may not rotate.    Please refer to the [facility] website for the visitor guidelines for Inpatients (after your surgery is over and you are in a regular room).    Your procedure is scheduled on: 07/27/23   Report to [facility] Main Entrance    Report to admitting at 6:15 AM   Call this number if you have problems the morning of surgery [phone]   Do not eat food :After Midnight.   After Midnight you may have the following liquids until 5:45 AM DAY OF SURGERY  Water  Non-Citrus Juices (without pulp, NO RED-Apple, White grape, White cranberry) Black Coffee (NO MILK/CREAM OR CREAMERS, sugar ok)  Clear Tea (NO MILK/CREAM OR CREAMERS, sugar ok) regular and decaf                             Plain Jell-O (NO RED)                                           Fruit ices (not with fruit pulp, NO RED)                                     Popsicles (NO RED)                                                               Sports drinks like Gatorade (NO RED)                 The day of surgery:  Drink ONE (1) Pre-Surgery G2 at 5:45 AM the morning of surgery. Drink in one sitting. Do not sip.  This drink was given to you during your hospital  pre-op appointment visit. Nothing else to drink after completing the  Pre-Surgery G2.          If you have questions, please contact your surgeon's  office.   FOLLOW BOWEL PREP AND ANY ADDITIONAL PRE OP INSTRUCTIONS YOU RECEIVED FROM YOUR SURGEON'S OFFICE!!!     Oral Hygiene is also important to reduce your risk of infection.                                    Remember - BRUSH YOUR TEETH THE MORNING OF SURGERY WITH YOUR REGULAR TOOTHPASTE  DENTURES WILL BE REMOVED PRIOR TO SURGERY PLEASE DO NOT APPLY "Poly grip" OR ADHESIVES!!!  Stop all vitamins and herbal supplements 7 days before surgery.   Take these medicines the morning of surgery with A SIP OF WATER :Tylenol               You may not have any metal on your body including hair pins, jewelry, and body piercing             Do not wear make-up, lotions, powders, perfumes, or deodorant  Do not wear nail polish including gel and S&S, artificial/acrylic nails, or any other type of covering on natural nails including finger and toenails. If you have artificial nails, gel coating, etc. that needs to be removed by a nail salon please have this removed prior to surgery or surgery may need to be canceled/ delayed if the surgeon/ anesthesia feels like they are unable to be safely monitored.   Do not shave  48 hours prior to surgery.    Do not bring valuables to the hospital. [facility] IS NOT             RESPONSIBLE   FOR VALUABLES.   Contacts, glasses, dentures or bridgework may not be worn into surgery.   Bring small overnight bag day of surgery.   DO NOT BRING YOUR HOME MEDICATIONS TO THE HOSPITAL. PHARMACY WILL DISPENSE MEDICATIONS LISTED ON YOUR MEDICATION LIST TO YOU DURING YOUR ADMISSION IN THE HOSPITAL!   Special Instructions: Bring a copy of your healthcare power of attorney and living will documents the day of surgery if you haven't scanned them before.              Please read over the following fact sheets you were given: IF YOU HAVE QUESTIONS ABOUT YOUR PRE-OP INSTRUCTIONS PLEASE CALL [name]   If you received a COVID test during your pre-op visit  it is  requested that you wear a mask when out in public, stay away from anyone that may not be feeling well and notify your surgeon if you develop symptoms. If you test positive for Covid or have been in contact with anyone that has tested positive in the last 10 days please notify you surgeon.      Pre-operative 5 CHG Bath Instructions   You can play a key role in reducing the risk of infection after surgery. Your skin needs to be as free of germs as possible. You can reduce the number of germs on your skin by washing with CHG (chlorhexidine  gluconate) soap before surgery. CHG is an antiseptic soap that kills germs and continues to kill germs even after washing.   DO NOT use if you have an allergy to chlorhexidine /CHG or antibacterial soaps. If your skin becomes reddened or irritated, stop using the CHG and notify one of our RNs at [phone].   Please shower with the CHG soap starting 4 days before surgery using the following schedule:     Please keep in mind the following:  DO NOT shave, including legs and underarms, starting the day of your first shower.   You may shave your face at any point before/day of surgery.  Place clean sheets on your bed the day you start using CHG soap. Use a clean washcloth (not used since being washed) for each shower. DO NOT sleep with pets once you start using the CHG.   CHG Shower Instructions:  If you choose to wash your hair and private area, wash first with your normal shampoo/soap.  After you use shampoo/soap, rinse your hair and body thoroughly to  remove shampoo/soap residue.  Turn the water  OFF and apply about 3 tablespoons (45 ml) of CHG soap to a CLEAN washcloth.  Apply CHG soap ONLY FROM YOUR NECK DOWN TO YOUR TOES (washing for 3-5 minutes)  DO NOT use CHG soap on face, private areas, open wounds, or sores.  Pay special attention to the area where your surgery is being performed.  If you are having back surgery, having someone wash your back for  you may be helpful. Wait 2 minutes after CHG soap is applied, then you may rinse off the CHG soap.  Pat dry with a clean towel  Put on clean clothes/pajamas   If you choose to wear lotion, please use ONLY the CHG-compatible lotions on the back of this paper.     Additional instructions for the day of surgery: DO NOT APPLY any lotions, deodorants, cologne, or perfumes.   Put on clean/comfortable clothes.  Brush your teeth.  Ask your nurse before applying any prescription medications to the skin.      CHG Compatible Lotions   Aveeno Moisturizing lotion  Cetaphil Moisturizing Cream  Cetaphil Moisturizing Lotion  Clairol Herbal Essence Moisturizing Lotion, Dry Skin  Clairol Herbal Essence Moisturizing Lotion, Extra Dry Skin  Clairol Herbal Essence Moisturizing Lotion, Normal Skin  Curel Age Defying Therapeutic Moisturizing Lotion with Alpha Hydroxy  Curel Extreme Care Body Lotion  Curel Soothing Hands Moisturizing Hand Lotion  Curel Therapeutic Moisturizing Cream, Fragrance-Free  Curel Therapeutic Moisturizing Lotion, Fragrance-Free  Curel Therapeutic Moisturizing Lotion, Original Formula  Eucerin Daily Replenishing Lotion  Eucerin Dry Skin Therapy Plus Alpha Hydroxy Crme  Eucerin Dry Skin Therapy Plus Alpha Hydroxy Lotion  Eucerin Original Crme  Eucerin Original Lotion  Eucerin Plus Crme Eucerin Plus Lotion  Eucerin TriLipid Replenishing Lotion  Keri Anti-Bacterial Hand Lotion  Keri Deep Conditioning Original Lotion Dry Skin Formula Softly Scented  Keri Deep Conditioning Original Lotion, Fragrance Free Sensitive Skin Formula  Keri Lotion Fast Absorbing Fragrance Free Sensitive Skin Formula  Keri Lotion Fast Absorbing Softly Scented Dry Skin Formula  Keri Original Lotion  Keri Skin Renewal Lotion Keri Silky Smooth Lotion  Keri Silky Smooth Sensitive Skin Lotion  Nivea Body Creamy Conditioning Oil  Nivea Body Extra Enriched Contractor Moisturizing Lotion Nivea Crme  Nivea Skin Firming Lotion  NutraDerm 30 Skin Lotion  NutraDerm Skin Lotion  NutraDerm Therapeutic Skin Cream  NutraDerm Therapeutic Skin Lotion  ProShield Protective Hand Cream  Provon moisturizing lotion   View Pre-Surgery Education Videos:  [email or web address]    WHAT IS A BLOOD TRANSFUSION? Blood Transfusion Information  A transfusion is the replacement of blood or some of its parts. Blood is made up of multiple cells which provide different functions. Red blood cells carry oxygen and are used for blood loss replacement. White blood cells fight against infection. Platelets control bleeding. Plasma helps clot blood. Other blood products are available for specialized needs, such as hemophilia or other clotting disorders. BEFORE THE TRANSFUSION  Who gives blood for transfusions?  Healthy volunteers who are fully evaluated to make sure their blood is safe. This is blood bank blood. Transfusion therapy is the safest it has ever been in the practice of medicine. Before blood is taken from a donor, a complete history is taken to make sure that person has no history of diseases nor engages in risky social behavior (examples are intravenous drug use or sexual activity with multiple partners). The donor's travel history is screened  to minimize risk of transmitting infections, such as malaria. The donated blood is tested for signs of infectious diseases, such as HIV and hepatitis. The blood is then tested to be sure it is compatible with you in order to minimize the chance of a transfusion reaction. If you or a relative donates blood, this is often done in anticipation of surgery and is not appropriate for emergency situations. It takes many days to process the donated blood. RISKS AND COMPLICATIONS Although transfusion therapy is very safe and saves many lives, the main dangers of transfusion  include:  Getting an infectious disease. Developing a transfusion reaction. This is an allergic reaction to something in the blood you were given. Every precaution is taken to prevent this. The decision to have a blood transfusion has been considered carefully by your caregiver before blood is given. Blood is not given unless the benefits outweigh the risks. AFTER THE TRANSFUSION Right after receiving a blood transfusion, you will usually feel much better and more energetic. This is especially true if your red blood cells have gotten low (anemic). The transfusion raises the level of the red blood cells which carry oxygen, and this usually causes an energy increase. The nurse administering the transfusion will monitor you carefully for complications. HOME CARE INSTRUCTIONS  No special instructions are needed after a transfusion. You may find your energy is better. Speak with your caregiver about any limitations on activity for underlying diseases you may have. SEEK MEDICAL CARE IF:  Your condition is not improving after your transfusion. You develop redness or irritation at the intravenous (IV) site. SEEK IMMEDIATE MEDICAL CARE IF:  Any of the following symptoms occur over the next 12 hours: Shaking chills. You have a temperature by mouth above 102 F (38.9 C), not controlled by medicine. Chest, back, or muscle pain. People around you feel you are not acting correctly or are confused. Shortness of breath or difficulty breathing. Dizziness and fainting. You get a rash or develop hives. You have a decrease in urine output. Your urine turns a dark color or changes to pink, red, or brown. Any of the following symptoms occur over the next 10 days: You have a temperature by mouth above 102 F (38.9 C), not controlled by medicine. Shortness of breath. Weakness after normal activity. The white part of the eye turns yellow (jaundice). You have a decrease in the amount of urine or are urinating  less often. Your urine turns a dark color or changes to pink, red, or brown. Document Released: 01/21/2000 Document Revised: 04/17/2011 Document Reviewed: 09/09/2007 [employer] Patient Information 2014 [place], [place].  _______________________________________________________________________  Incentive Spirometer  An incentive spirometer is a tool that can help keep your lungs clear and active. This tool measures how well you are filling your lungs with each breath. Taking long deep breaths may help reverse or decrease the chance of developing breathing (pulmonary) problems (especially infection) following: A long period of time when you are unable to move or be active. BEFORE THE PROCEDURE  If the spirometer includes an indicator to show your best effort, your nurse or respiratory therapist will set it to a desired goal. If possible, sit up straight or lean slightly forward. Try not to slouch. Hold the incentive spirometer in an upright position. INSTRUCTIONS FOR USE  Sit on the edge of your bed if possible, or sit up as far as you can in bed or on a chair. Hold the incentive spirometer in an upright position. Breathe out normally. Place  the mouthpiece in your mouth and seal your lips tightly around it. Breathe in slowly and as deeply as possible, raising the piston or the ball toward the top of the column. Hold your breath for 3-5 seconds or for as long as possible. Allow the piston or ball to fall to the bottom of the column. Remove the mouthpiece from your mouth and breathe out normally. Rest for a few seconds and repeat Steps 1 through 7 at least 10 times every 1-2 hours when you are awake. Take your time and take a few normal breaths between deep breaths. The spirometer may include an indicator to show your best effort. Use the indicator as a goal to work toward during each repetition. After each set of 10 deep breaths, practice coughing to be sure your lungs are clear. If you have an  incision (the cut made at the time of surgery), support your incision when coughing by placing a pillow or rolled up towels firmly against it. Once you are able to get out of bed, walk around indoors and cough well. You may stop using the incentive spirometer when instructed by your caregiver.  RISKS AND COMPLICATIONS Take your time so you do not get dizzy or light-headed. If you are in pain, you may need to take or ask for pain medication before doing incentive spirometry. It is harder to take a deep breath if you are having pain. AFTER USE Rest and breathe slowly and easily. It can be helpful to keep track of a log of your progress. Your caregiver can provide you with a simple table to help with this. If you are using the spirometer at home, follow these instructions: SEEK MEDICAL CARE IF:  You are having difficultly using the spirometer. You have trouble using the spirometer as often as instructed. Your pain medication is not giving enough relief while using the spirometer. You develop fever of 100.5 F (38.1 C) or higher. SEEK IMMEDIATE MEDICAL CARE IF:  You cough up bloody sputum that had not been present before. You develop fever of 102 F (38.9 C) or greater. You develop worsening pain at or near the incision site. MAKE SURE YOU:  Understand these instructions. Will watch your condition. Will get help right away if you are not doing well or get worse. Document Released: 06/05/2006 Document Revised: 04/17/2011 Document Reviewed: 08/06/2006 [facility] Patient Information 2014 [place], [place].   ________________________________________________________________________

## 2023-07-17 ENCOUNTER — Other Ambulatory Visit: Payer: Self-pay

## 2023-07-17 ENCOUNTER — Encounter (HOSPITAL_COMMUNITY): Payer: Self-pay

## 2023-07-17 ENCOUNTER — Encounter (HOSPITAL_COMMUNITY)
Admission: RE | Admit: 2023-07-17 | Discharge: 2023-07-17 | Disposition: A | Discharge status: Home / Self Care | Source: Ambulatory Visit | Attending: Orthopaedic Surgery | Admitting: Orthopaedic Surgery

## 2023-07-17 VITALS — BP 122/52 | HR 73 | Temp 98.0°F | Resp 16 | Ht 62.0 in | Wt 145.0 lb

## 2023-07-17 DIAGNOSIS — M1612 Unilateral primary osteoarthritis, left hip: Secondary | ICD-10-CM | POA: Diagnosis not present

## 2023-07-17 DIAGNOSIS — Z01818 Encounter for other preprocedural examination: Secondary | ICD-10-CM | POA: Diagnosis not present

## 2023-07-17 HISTORY — DX: Malignant (primary) neoplasm, unspecified: C80.1

## 2023-07-17 LAB — CBC
HCT: 36.3 % (ref 36.0–46.0)
Hemoglobin: 11.7 g/dL — ABNORMAL LOW (ref 12.0–15.0)
MCH: 29.8 pg (ref 26.0–34.0)
MCHC: 32.2 g/dL (ref 30.0–36.0)
MCV: 92.4 fL (ref 80.0–100.0)
Platelets: 367 10*3/uL (ref 150–400)
RBC: 3.93 MIL/uL (ref 3.87–5.11)
RDW: 13.4 % (ref 11.5–15.5)
WBC: 7 10*3/uL (ref 4.0–10.5)
nRBC: 0 % (ref 0.0–0.2)

## 2023-07-17 LAB — BASIC METABOLIC PANEL WITH GFR
Anion gap: 9 (ref 5–15)
BUN: 20 mg/dL (ref 8–23)
CO2: 27 mmol/L (ref 22–32)
Calcium: 9.8 mg/dL (ref 8.9–10.3)
Chloride: 104 mmol/L (ref 98–111)
Creatinine, Ser: 0.79 mg/dL (ref 0.44–1.00)
GFR, Estimated: >60 mL/min (ref >60)
Glucose, Bld: 133 mg/dL — ABNORMAL HIGH (ref 70–99)
Potassium: 3.7 mmol/L (ref 3.5–5.1)
Sodium: 140 mmol/L (ref 135–145)

## 2023-07-17 LAB — SURGICAL PCR SCREEN
MRSA, PCR: NEGATIVE
Staphylococcus aureus: NEGATIVE

## 2023-07-18 DIAGNOSIS — M1612 Unilateral primary osteoarthritis, left hip: Secondary | ICD-10-CM | POA: Diagnosis not present

## 2023-07-24 DIAGNOSIS — Z1231 Encounter for screening mammogram for malignant neoplasm of breast: Secondary | ICD-10-CM | POA: Diagnosis not present

## 2023-07-26 ENCOUNTER — Telehealth: Payer: Self-pay | Admitting: *Deleted

## 2023-07-26 NOTE — H&P (Signed)
 TOTAL HIP ADMISSION H&P  Patient is admitted for left total hip arthroplasty.  Subjective:  Chief Complaint: left hip pain  HPI: Courtney Wallace, 68 y.o. female, has a history of pain and functional disability in the left hip(s) due to arthritis and patient has failed non-surgical conservative treatments for greater than 12 weeks to include NSAID's and/or analgesics, corticosteriod injections, use of assistive devices, and activity modification.  Onset of symptoms was gradual starting several years ago with gradually worsening course since that time.The patient noted no past surgery on the left hip(s).  Patient currently rates pain in the left hip at 10 out of 10 with activity. Patient has night pain, worsening of pain with activity and weight bearing, pain that interfers with activities of daily living, and pain with passive range of motion. Patient has evidence of subchondral sclerosis, periarticular osteophytes, and joint space narrowing by imaging studies. This condition presents safety issues increasing the risk of falls.  There is no current active infection.  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   Dyslipidemia    Fatty liver    GERD (gastroesophageal reflux disease)    pt denies   Osteoarthritis, hip, bilateral    Prediabetes    Right hip pain    bursitis    Past Surgical History:  Procedure Laterality Date   COLONOSCOPY  08/30/2010   Procedure: COLONOSCOPY;  Surgeon: Beau Bound;  Location: [facility];  Service: Gastroenterology;  Laterality: N/A;   COLONOSCOPY WITH PROPOFOL  N/A 02/07/2022    Procedure: COLONOSCOPY WITH PROPOFOL ;  Surgeon: Marijo Shove, DO;  Location: [facility];  Service: General;  Laterality: N/A;   DILATION AND CURETTAGE OF UTERUS  yrs ago   MASS EXCISION Left 10/02/2019   Procedure: EXCISION LEFT ARM SUBCUTANEOUS MASS;  Surgeon: Kinsinger, Alphonso Aschoff, MD;  [facility];  Service: General;  Laterality: Left;   POLYPECTOMY  02/07/2022   Procedure: POLYPECTOMY;  Surgeon: Marijo Shove, DO;  Location: [facility];  Service: General;;   TONSILLECTOMY  age 59   TOTAL HIP ARTHROPLASTY Right 06/28/2022   Procedure: RIGHT TOTAL HIP ARTHROPLASTY ANTERIOR APPROACH;  Surgeon: Adah Acron, MD;  Location: [facility] OR;  Service: Orthopedics;  Laterality: Right;  Needs RNFA   TUBAL LIGATION      No current facility-administered medications for this encounter.   Current Outpatient Medications  Medication Sig Dispense Refill Last Dose/Taking   acetaminophen  (TYLENOL ) 650 MG CR tablet Take 1,300 mg by mouth every 8 (eight) hours as needed for pain.   Taking As Needed   CALCIUM  PO Take 600 mg by mouth daily.   Taking   cholecalciferol  (VITAMIN D3) 25 MCG (1000 UNIT) tablet Take 1,000 Units by mouth daily.   Taking   Cyanocobalamin  (VITAMIN B-12) 2500 MCG SUBL Place 2,500 mcg under the tongue every other day.   Taking   OVER THE COUNTER MEDICATION    Taking   aspirin  81 MG chewable tablet Chew 1 tablet (81 mg total) by mouth daily. Take for 4 wks then  stop (Patient not taking: Reported on 07/12/2023)   Not Taking   HYDROcodone -acetaminophen  (NORCO/VICODIN) 5-325 MG tablet TAKE ONE TO TWO TABLETS BY MOUTH EVERY FOUR HOURS AS NEEDED FOR MODERATE PAIN (PAIN SCORE 4-6) (Patient not taking: Reported on 07/12/2023) 45 tablet 0 Not Taking   methocarbamol  (ROBAXIN ) 500 MG tablet Take 1 tablet (500 mg total) by mouth every 8 (eight) hours as needed for muscle spasms. (Patient not taking: Reported on 07/12/2023) 30 tablet 1 Not Taking   Sodium  Sulfate-Mag Sulfate-KCl (SUTAB ) [phone] MG TABS Take as directed based on the instructions you received in the office 24 tablet 0    Allergies  Allergen Reactions   Penicillins Rash    Has patient had a PCN reaction causing immediate rash, facial/tongue/throat swelling, SOB or lightheadedness with hypotension: Yes Has patient had a PCN reaction causing severe rash involving mucus membranes or skin necrosis: No Has patient had a PCN reaction that required hospitalization No Has patient had a PCN reaction occurring within the last 10 years: No If all of the above answers are NO, then may proceed with Cephalosporin use.     Social History   Tobacco Use   Smoking status: Never   Smokeless tobacco: Never  Substance Use Topics   Alcohol use: No    Family History  Problem Relation Age of Onset   Dementia Mother    Melanoma Mother    Cirrhosis Father    Breast cancer Sister    Neuropathy Neg Hx      Review of Systems  Objective:  Physical Exam Vitals reviewed.  Constitutional:      Appearance: Normal appearance.  HENT:     Head: Normocephalic and atraumatic.   Eyes:     Extraocular Movements: Extraocular movements intact.     Pupils: Pupils are equal, round, and reactive to light.    Cardiovascular:     Rate and Rhythm: Normal rate and regular rhythm.     Pulses: Normal pulses.  Pulmonary:     Effort: Pulmonary effort is normal.     Breath sounds: Normal breath sounds.  Abdominal:     Palpations: Abdomen is soft.   Musculoskeletal:     Cervical back: Normal range of motion and neck supple.     Left hip: Tenderness and bony tenderness present. Decreased range of motion. Decreased strength.   Neurological:     Mental Status: She is alert and oriented to person, place, and time.   Psychiatric:        Behavior: Behavior normal.     Vital signs in last 24 hours:    Labs:   Estimated body mass index is 26.52 kg/m as calculated from the following:    Height as of 07/17/23: 5' 2 (1.575 m).   Weight as of 07/17/23: 65.8 kg.   Imaging Review Plain radiographs demonstrate severe degenerative joint disease of the left hip(s). The bone quality appears to be good for age and reported activity level.      Assessment/Plan:  End stage arthritis, left hip(s)  The patient history, physical examination, clinical judgement of the provider and imaging studies are consistent with end stage degenerative joint disease of the left hip(s) and total hip arthroplasty is deemed medically necessary. The treatment options including medical management, injection therapy, arthroscopy and arthroplasty were discussed at length. The risks and benefits of total hip arthroplasty were presented and reviewed. The risks due to aseptic loosening, infection, stiffness, dislocation/subluxation,  thromboembolic complications and other imponderables were discussed.  The patient acknowledged the explanation, agreed to proceed with the plan and consent was signed. Patient is being admitted for inpatient treatment for surgery, pain control, PT, OT, prophylactic antibiotics, VTE prophylaxis, progressive ambulation and ADL's and discharge planning.The patient is planning to be discharged home with home health services

## 2023-07-26 NOTE — Care Plan (Signed)
 [facility] RNCM call to patient to discuss her upcoming Left total hip arthroplasty with Dr. Lucienne Ryder on 07/27/23 at [facility]. She is agreeable to case management. She lives with her spouse, who will be able to assist after discharge home along with her daughter. She has a RW and 3in1/BSC. No other DME needed. Anticipate HHPT will be needed after a short hospital stay. Referral made to [facility] after choice provided. Reviewed post op care instructions and will continue to follow for needs.

## 2023-07-26 NOTE — Anesthesia Preprocedure Evaluation (Addendum)
 Anesthesia Evaluation  Patient identified by MRN, date of birth, ID band Patient awake    Reviewed: Allergy & Precautions, NPO status , Patient's Chart, lab work & pertinent test results  Airway Mallampati: II  TM Distance: >3 FB Neck ROM: Full    Dental  (+) Teeth Intact, Dental Advisory Given   Pulmonary neg pulmonary ROS   Pulmonary exam normal breath sounds clear to auscultation       Cardiovascular negative cardio ROS Normal cardiovascular exam Rhythm:Regular Rate:Normal     Neuro/Psych negative neurological ROS  negative psych ROS   GI/Hepatic Neg liver ROS,GERD  ,,  Endo/Other  negative endocrine ROS    Renal/GU negative Renal ROS     Musculoskeletal  (+) Arthritis  (left hip osteoarthritis),    Abdominal   Peds  Hematology  (+) Blood dyscrasia, anemia Plt 367k   Anesthesia Other Findings   Reproductive/Obstetrics                             Anesthesia Physical Anesthesia Plan  ASA: 2  Anesthesia Plan: Spinal   Post-op Pain Management: Tylenol  PO (pre-op)* and Toradol  IV (intra-op)*   Induction: Intravenous  PONV Risk Score and Plan: 2 and TIVA, Midazolam , Dexamethasone  and Ondansetron   Airway Management Planned: Natural Airway and Simple Face Mask  Additional Equipment:   Intra-op Plan:   Post-operative Plan:   Informed Consent: I have reviewed the patients History and Physical, chart, labs and discussed the procedure including the risks, benefits and alternatives for the proposed anesthesia with the patient or authorized representative who has indicated his/her understanding and acceptance.     Dental advisory given  Plan Discussed with: CRNA, Anesthesiologist and Surgeon  Anesthesia Plan Comments: (Discussed risks and benefits of and differences between spinal and general. Discussed risks of spinal including headache, backache, failure, bleeding, infection, and  nerve damage. Patient consents to spinal. Questions answered. Coagulation studies and platelet count acceptable.)       Anesthesia Quick Evaluation

## 2023-07-26 NOTE — Telephone Encounter (Signed)
 [facility] RNCM pre-op  call completed.

## 2023-07-27 ENCOUNTER — Observation Stay (HOSPITAL_COMMUNITY)

## 2023-07-27 ENCOUNTER — Other Ambulatory Visit: Payer: Self-pay

## 2023-07-27 ENCOUNTER — Ambulatory Visit (HOSPITAL_COMMUNITY)

## 2023-07-27 ENCOUNTER — Ambulatory Visit (HOSPITAL_COMMUNITY): Admitting: Anesthesiology

## 2023-07-27 ENCOUNTER — Encounter (HOSPITAL_COMMUNITY): Payer: Self-pay | Admitting: Orthopaedic Surgery

## 2023-07-27 ENCOUNTER — Encounter (HOSPITAL_COMMUNITY)
Admission: RE | Disposition: A | Discharge status: Home / Self Care | Payer: Self-pay | Source: Ambulatory Visit | Attending: Orthopaedic Surgery

## 2023-07-27 ENCOUNTER — Observation Stay (HOSPITAL_COMMUNITY)
Admission: RE | Admit: 2023-07-27 | Discharge: 2023-07-29 | Disposition: A | Discharge status: Home / Self Care | Source: Ambulatory Visit | Attending: Orthopaedic Surgery | Admitting: Orthopaedic Surgery

## 2023-07-27 DIAGNOSIS — Z96642 Presence of left artificial hip joint: Secondary | ICD-10-CM | POA: Diagnosis not present

## 2023-07-27 DIAGNOSIS — M1612 Unilateral primary osteoarthritis, left hip: Secondary | ICD-10-CM | POA: Diagnosis not present

## 2023-07-27 DIAGNOSIS — Z471 Aftercare following joint replacement surgery: Secondary | ICD-10-CM | POA: Diagnosis not present

## 2023-07-27 DIAGNOSIS — Z7982 Long term (current) use of aspirin: Secondary | ICD-10-CM | POA: Diagnosis not present

## 2023-07-27 DIAGNOSIS — Z79899 Other long term (current) drug therapy: Secondary | ICD-10-CM | POA: Diagnosis not present

## 2023-07-27 DIAGNOSIS — Z85828 Personal history of other malignant neoplasm of skin: Secondary | ICD-10-CM | POA: Diagnosis not present

## 2023-07-27 DIAGNOSIS — Z96643 Presence of artificial hip joint, bilateral: Secondary | ICD-10-CM | POA: Diagnosis not present

## 2023-07-27 DIAGNOSIS — Z96641 Presence of right artificial hip joint: Secondary | ICD-10-CM | POA: Insufficient documentation

## 2023-07-27 HISTORY — PX: TOTAL HIP ARTHROPLASTY: SHX124

## 2023-07-27 LAB — TYPE AND SCREEN
ABO/RH(D): O NEG
Antibody Screen: NEGATIVE

## 2023-07-27 SURGERY — ARTHROPLASTY, HIP, TOTAL, ANTERIOR APPROACH
Anesthesia: Spinal | Site: Hip | Laterality: Left

## 2023-07-27 MED ORDER — SODIUM CHLORIDE 0.9 % IV SOLN: INTRAVENOUS | Status: DC

## 2023-07-27 MED ORDER — MENTHOL 3 MG MT LOZG: 1.0000 | LOZENGE | OROMUCOSAL | Status: DC | PRN

## 2023-07-27 MED ORDER — DOCUSATE SODIUM 100 MG PO CAPS
100.0000 mg | ORAL_CAPSULE | Freq: Two times a day (BID) | ORAL | Status: DC
  Administered 2023-07-27 – 2023-07-29 (×5): 100 mg via ORAL
  Filled 2023-07-27 (×5): qty 1

## 2023-07-27 MED ORDER — METHOCARBAMOL 500 MG PO TABS
500.0000 mg | ORAL_TABLET | Freq: Four times a day (QID) | ORAL | Status: DC | PRN
  Administered 2023-07-27 – 2023-07-29 (×6): 500 mg via ORAL
  Filled 2023-07-27 (×5): qty 1

## 2023-07-27 MED ORDER — ASPIRIN 81 MG PO CHEW
81.0000 mg | CHEWABLE_TABLET | Freq: Two times a day (BID) | ORAL | Status: DC
  Administered 2023-07-27 – 2023-07-29 (×4): 81 mg via ORAL
  Filled 2023-07-27 (×4): qty 1

## 2023-07-27 MED ORDER — HYDROMORPHONE HCL 1 MG/ML IJ SOLN
0.5000 mg | INTRAMUSCULAR | Status: DC | PRN
  Administered 2023-07-27 – 2023-07-28 (×2): 1 mg via INTRAVENOUS
  Filled 2023-07-27 (×2): qty 1

## 2023-07-27 MED ORDER — FENTANYL CITRATE (PF) 100 MCG/2ML IJ SOLN
INTRAMUSCULAR | Status: AC
  Filled 2023-07-27: qty 2

## 2023-07-27 MED ORDER — FENTANYL CITRATE PF 50 MCG/ML IJ SOSY
PREFILLED_SYRINGE | INTRAMUSCULAR | Status: AC
  Filled 2023-07-27: qty 2

## 2023-07-27 MED ORDER — VITAMIN B-12 1000 MCG PO TABS
2500.0000 ug | ORAL_TABLET | ORAL | Status: DC
  Administered 2023-07-28: 2500 ug via ORAL
  Filled 2023-07-27 (×2): qty 3

## 2023-07-27 MED ORDER — FENTANYL CITRATE PF 50 MCG/ML IJ SOSY
25.0000 ug | PREFILLED_SYRINGE | INTRAMUSCULAR | Status: DC | PRN
  Administered 2023-07-27: 50 ug via INTRAVENOUS

## 2023-07-27 MED ORDER — POVIDONE-IODINE 10 % EX SWAB: 2.0000 | Freq: Once | CUTANEOUS | Status: DC

## 2023-07-27 MED ORDER — LACTATED RINGERS IV SOLN: INTRAVENOUS | Status: DC

## 2023-07-27 MED ORDER — ALUM & MAG HYDROXIDE-SIMETH 200-200-20 MG/5ML PO SUSP: 30.0000 mL | ORAL | Status: DC | PRN

## 2023-07-27 MED ORDER — METHOCARBAMOL 1000 MG/10ML IJ SOLN: 500.0000 mg | Freq: Four times a day (QID) | INTRAMUSCULAR | Status: DC | PRN

## 2023-07-27 MED ORDER — ORAL CARE MOUTH RINSE
15.0000 mL | OROMUCOSAL | Status: DC | PRN
Start: 2023-07-27 — End: 2023-07-29

## 2023-07-27 MED ORDER — MIDAZOLAM HCL 5 MG/5ML IJ SOLN
INTRAMUSCULAR | Status: DC | PRN
  Administered 2023-07-27: 1 mg via INTRAVENOUS

## 2023-07-27 MED ORDER — FENTANYL CITRATE (PF) 100 MCG/2ML IJ SOLN
INTRAMUSCULAR | Status: DC | PRN
  Administered 2023-07-27: 50 ug via INTRAVENOUS

## 2023-07-27 MED ORDER — ORAL CARE MOUTH RINSE: 15.0000 mL | Freq: Once | OROMUCOSAL | Status: AC

## 2023-07-27 MED ORDER — SODIUM CHLORIDE 0.9 % IR SOLN
Status: DC | PRN
  Administered 2023-07-27: 1000 mL

## 2023-07-27 MED ORDER — LIDOCAINE HCL (PF) 2 % IJ SOLN
INTRAMUSCULAR | Status: DC | PRN
  Administered 2023-07-27: 40 mg via INTRADERMAL

## 2023-07-27 MED ORDER — VITAMIN D 25 MCG (1000 UNIT) PO TABS
1000.0000 [IU] | ORAL_TABLET | Freq: Every day | ORAL | Status: DC
  Administered 2023-07-28 – 2023-07-29 (×2): 1000 [IU] via ORAL
  Filled 2023-07-27 (×2): qty 1

## 2023-07-27 MED ORDER — ONDANSETRON HCL 4 MG/2ML IJ SOLN: 4.0000 mg | Freq: Once | INTRAMUSCULAR | Status: DC | PRN

## 2023-07-27 MED ORDER — ACETAMINOPHEN 500 MG PO TABS
1000.0000 mg | ORAL_TABLET | Freq: Once | ORAL | Status: AC
  Administered 2023-07-27: 1000 mg via ORAL
  Filled 2023-07-27: qty 2

## 2023-07-27 MED ORDER — PANTOPRAZOLE SODIUM 40 MG PO TBEC
40.0000 mg | DELAYED_RELEASE_TABLET | Freq: Every day | ORAL | Status: DC
  Administered 2023-07-27 – 2023-07-29 (×3): 40 mg via ORAL
  Filled 2023-07-27 (×3): qty 1

## 2023-07-27 MED ORDER — DIPHENHYDRAMINE HCL 12.5 MG/5ML PO ELIX: 12.5000 mg | ORAL_SOLUTION | ORAL | Status: DC | PRN

## 2023-07-27 MED ORDER — PROPOFOL 10 MG/ML IV BOLUS
INTRAVENOUS | Status: DC | PRN
  Administered 2023-07-27: 20 mg via INTRAVENOUS

## 2023-07-27 MED ORDER — ONDANSETRON HCL 4 MG/2ML IJ SOLN
4.0000 mg | Freq: Four times a day (QID) | INTRAMUSCULAR | Status: DC | PRN
  Administered 2023-07-27: 4 mg via INTRAVENOUS
  Filled 2023-07-27: qty 2

## 2023-07-27 MED ORDER — MIDAZOLAM HCL 2 MG/2ML IJ SOLN
INTRAMUSCULAR | Status: AC
Start: 2023-07-27 — End: 2023-07-27
  Filled 2023-07-27: qty 2

## 2023-07-27 MED ORDER — ONDANSETRON HCL 4 MG/2ML IJ SOLN
INTRAMUSCULAR | Status: DC | PRN
Start: 2023-07-27 — End: 2023-07-27
  Administered 2023-07-27: 4 mg via INTRAVENOUS

## 2023-07-27 MED ORDER — METOCLOPRAMIDE HCL 5 MG PO TABS: 5.0000 mg | ORAL_TABLET | Freq: Three times a day (TID) | ORAL | Status: DC | PRN

## 2023-07-27 MED ORDER — PROPOFOL 10 MG/ML IV BOLUS
INTRAVENOUS | Status: AC
  Filled 2023-07-27: qty 20

## 2023-07-27 MED ORDER — PHENOL 1.4 % MT LIQD: 1.0000 | OROMUCOSAL | Status: DC | PRN

## 2023-07-27 MED ORDER — METHOCARBAMOL 500 MG PO TABS
ORAL_TABLET | ORAL | Status: AC
  Filled 2023-07-27: qty 1

## 2023-07-27 MED ORDER — BUPIVACAINE IN DEXTROSE 0.75-8.25 % IT SOLN
INTRATHECAL | Status: DC | PRN
Start: 2023-07-27 — End: 2023-07-27
  Administered 2023-07-27: 1.8 mL via INTRATHECAL

## 2023-07-27 MED ORDER — 0.9 % SODIUM CHLORIDE (POUR BTL) OPTIME
TOPICAL | Status: DC | PRN
  Administered 2023-07-27: 1000 mL

## 2023-07-27 MED ORDER — CEFAZOLIN SODIUM-DEXTROSE 2-4 GM/100ML-% IV SOLN
2.0000 g | Freq: Four times a day (QID) | INTRAVENOUS | Status: AC
  Administered 2023-07-27 (×2): 2 g via INTRAVENOUS
  Filled 2023-07-27 (×2): qty 100

## 2023-07-27 MED ORDER — OXYCODONE HCL 5 MG PO TABS
5.0000 mg | ORAL_TABLET | ORAL | Status: DC | PRN
  Administered 2023-07-27: 5 mg via ORAL
  Administered 2023-07-28 – 2023-07-29 (×3): 10 mg via ORAL
  Filled 2023-07-27 (×4): qty 2
  Filled 2023-07-27: qty 1

## 2023-07-27 MED ORDER — METOCLOPRAMIDE HCL 5 MG/ML IJ SOLN: 5.0000 mg | Freq: Three times a day (TID) | INTRAMUSCULAR | Status: DC | PRN

## 2023-07-27 MED ORDER — CEFAZOLIN SODIUM-DEXTROSE 2-4 GM/100ML-% IV SOLN
2.0000 g | INTRAVENOUS | Status: AC
  Administered 2023-07-27: 2 g via INTRAVENOUS
  Filled 2023-07-27: qty 100

## 2023-07-27 MED ORDER — ONDANSETRON HCL 4 MG PO TABS: 4.0000 mg | ORAL_TABLET | Freq: Four times a day (QID) | ORAL | Status: DC | PRN

## 2023-07-27 MED ORDER — TRANEXAMIC ACID-NACL 1000-0.7 MG/100ML-% IV SOLN
1000.0000 mg | INTRAVENOUS | Status: AC
  Administered 2023-07-27: 1000 mg via INTRAVENOUS
  Filled 2023-07-27: qty 100

## 2023-07-27 MED ORDER — PROPOFOL 500 MG/50ML IV EMUL
INTRAVENOUS | Status: DC | PRN
  Administered 2023-07-27: 75 ug/kg/min via INTRAVENOUS

## 2023-07-27 MED ORDER — CHLORHEXIDINE GLUCONATE 0.12 % MT SOLN
15.0000 mL | Freq: Once | OROMUCOSAL | Status: AC
  Administered 2023-07-27: 15 mL via OROMUCOSAL

## 2023-07-27 MED ORDER — ACETAMINOPHEN 325 MG PO TABS
325.0000 mg | ORAL_TABLET | Freq: Four times a day (QID) | ORAL | Status: DC | PRN
  Administered 2023-07-27 – 2023-07-28 (×2): 650 mg via ORAL
  Filled 2023-07-27 (×2): qty 2

## 2023-07-27 MED ORDER — OXYCODONE HCL 5 MG PO TABS
10.0000 mg | ORAL_TABLET | ORAL | Status: DC | PRN
  Administered 2023-07-27 (×2): 15 mg via ORAL
  Administered 2023-07-28 – 2023-07-29 (×4): 10 mg via ORAL
  Filled 2023-07-27 (×2): qty 2
  Filled 2023-07-27: qty 3
  Filled 2023-07-27: qty 2
  Filled 2023-07-27: qty 3
  Filled 2023-07-27: qty 2
  Filled 2023-07-27: qty 3

## 2023-07-27 MED ORDER — PHENYLEPHRINE HCL-NACL 20-0.9 MG/250ML-% IV SOLN
INTRAVENOUS | Status: DC | PRN
  Administered 2023-07-27: 25 ug/min via INTRAVENOUS

## 2023-07-27 SURGICAL SUPPLY — 36 items
BAG COUNTER SPONGE SURGICOUNT (BAG) ×1 IMPLANT
BAG ZIPLOCK 12X15 (MISCELLANEOUS) IMPLANT
BALL HIP CERAMIC (Hips) IMPLANT
BENZOIN TINCTURE PRP APPL 2/3 (GAUZE/BANDAGES/DRESSINGS) IMPLANT
BLADE SAW SGTL 18X1.27X75 (BLADE) ×1 IMPLANT
COVER PERINEAL POST (MISCELLANEOUS) ×1 IMPLANT
COVER SURGICAL LIGHT HANDLE (MISCELLANEOUS) ×1 IMPLANT
CUP SECTOR GRIPTON 50MM (Cup) IMPLANT
DRAPE FOOT SWITCH (DRAPES) ×1 IMPLANT
DRAPE STERI IOBAN 125X83 (DRAPES) ×1 IMPLANT
DRAPE U-SHAPE 47X51 STRL (DRAPES) ×2 IMPLANT
DRSG AQUACEL AG ADV 3.5X10 (GAUZE/BANDAGES/DRESSINGS) ×1 IMPLANT
DURAPREP 26ML APPLICATOR (WOUND CARE) ×1 IMPLANT
ELECT PENCIL ROCKER SW 15FT (MISCELLANEOUS) ×1 IMPLANT
ELECT REM PT RETURN 15FT ADLT (MISCELLANEOUS) ×1 IMPLANT
GAUZE XEROFORM 1X8 LF (GAUZE/BANDAGES/DRESSINGS) IMPLANT
GLOVE BIO SURGEON STRL SZ7.5 (GLOVE) ×1 IMPLANT
GLOVE BIOGEL PI IND STRL 8 (GLOVE) ×2 IMPLANT
GLOVE ECLIPSE 8.0 STRL XLNG CF (GLOVE) ×1 IMPLANT
GOWN STRL REUS W/ TWL XL LVL3 (GOWN DISPOSABLE) ×2 IMPLANT
HOLDER FOLEY CATH W/STRAP (MISCELLANEOUS) ×1 IMPLANT
KIT TURNOVER KIT A (KITS) ×1 IMPLANT
LINER ACETABULAR 32X50 (Liner) IMPLANT
PACK ANTERIOR HIP CUSTOM (KITS) ×1 IMPLANT
SET HNDPC FAN SPRY TIP SCT (DISPOSABLE) ×1 IMPLANT
STAPLER SKIN PROX 35W (STAPLE) IMPLANT
STEM FEM ACTIS STD SZ4 (Stem) IMPLANT
STRIP CLOSURE SKIN 1/2X4 (GAUZE/BANDAGES/DRESSINGS) IMPLANT
SUT ETHIBOND NAB CT1 #1 30IN (SUTURE) ×1 IMPLANT
SUT ETHILON 2 0 PS N (SUTURE) IMPLANT
SUT MNCRL AB 4-0 PS2 18 (SUTURE) IMPLANT
SUT VIC AB 0 CT1 36 (SUTURE) ×1 IMPLANT
SUT VIC AB 1 CT1 36 (SUTURE) ×1 IMPLANT
SUT VIC AB 2-0 CT1 TAPERPNT 27 (SUTURE) ×2 IMPLANT
TRAY FOLEY MTR SLVR 16FR STAT (SET/KITS/TRAYS/PACK) IMPLANT
YANKAUER SUCT BULB TIP NO VENT (SUCTIONS) ×1 IMPLANT

## 2023-07-27 NOTE — Anesthesia Postprocedure Evaluation (Signed)
 Anesthesia Post Note  Patient: Courtney Wallace  Procedure(s) Performed: ARTHROPLASTY, HIP, TOTAL, ANTERIOR APPROACH (Left: Hip)     Patient location during evaluation: PACU Anesthesia Type: Spinal Level of consciousness: awake, awake and alert and oriented Pain management: pain level controlled Vital Signs Assessment: post-procedure vital signs reviewed and stable Respiratory status: spontaneous breathing, nonlabored ventilation and respiratory function stable Cardiovascular status: blood pressure returned to baseline and stable Postop Assessment: no headache, no backache, spinal receding and no apparent nausea or vomiting Anesthetic complications: no   No notable events documented.  Last Vitals:  Vitals:   07/27/23 1213 07/27/23 1417  BP: 120/61 135/65  Pulse: (!) 57 73  Resp: 15 17  Temp: (!) 36.3 C (!) 36.4 C  SpO2: 99% 95%    Last Pain:  Vitals:   07/27/23 1348  TempSrc:   PainSc: 2                  Erin Havers

## 2023-07-27 NOTE — Transfer of Care (Signed)
 Immediate Anesthesia Transfer of Care Note  Patient: Courtney Wallace  Procedure(s) Performed: ARTHROPLASTY, HIP, TOTAL, ANTERIOR APPROACH (Left: Hip)  Patient Location: PACU  Anesthesia Type:MAC and Spinal  Level of Consciousness: drowsy and patient cooperative  Airway & Oxygen Therapy: Patient Spontanous Breathing and Patient connected to face mask oxygen  Post-op Assessment: Report given to RN and Post -op Vital signs reviewed and stable  Post vital signs: Reviewed and stable  Last Vitals:  Vitals Value Taken Time  BP 96/58 07/27/23 10:15  Temp    Pulse 59 07/27/23 10:18  Resp 14 07/27/23 10:18  SpO2 100 % 07/27/23 10:18  Vitals shown include unfiled device data.  Last Pain:  Vitals:   07/27/23 0702  TempSrc:   PainSc: 0-No pain         Complications: No notable events documented.

## 2023-07-27 NOTE — Evaluation (Signed)
 Physical Therapy Evaluation Patient Details Name: Courtney Wallace MRN: [id number] DOB: 06-Oct-1955 Today's Date: 07/27/2023  History of Present Illness  Pt s/p L THR and with hx of R THR ~ 1 YR AGO  Clinical Impression  Pt s/p L THR and presents with decreased L LE strength/ROM and post op pain limiting functional mobility.  Pt should progress to dc home with family assist.        If plan is discharge home, recommend the following: A little help with walking and/or transfers;A little help with bathing/dressing/bathroom;Assistance with cooking/housework;Assist for transportation;Help with stairs or ramp for entrance   Can travel by private vehicle        Equipment Recommendations None recommended by PT  Recommendations for Other Services       Functional Status Assessment Patient has had a recent decline in their functional status and demonstrates the ability to make significant improvements in function in a reasonable and predictable amount of time.     Precautions / Restrictions Precautions Precautions: Fall Recall of Precautions/Restrictions: Intact Restrictions Weight Bearing Restrictions Per Provider Order: Yes LLE Weight Bearing Per Provider Order: Weight bearing as tolerated      Mobility  Bed Mobility Overal bed mobility: Needs Assistance Bed Mobility: Supine to Sit     Supine to sit: Min assist, Mod assist     General bed mobility comments: Increased time with cues for sequence and use of R LE to self assist    Transfers Overall transfer level: Needs assistance Equipment used: Rolling walker (2 wheels) Transfers: Sit to/from Stand, Bed to chair/wheelchair/BSC Sit to Stand: Min assist, Mod assist, From elevated surface   Step pivot transfers: Min assist, Mod assist       General transfer comment: cues for LE management and use of UEs to self assist; step pvt bed to recliner    Ambulation/Gait               General Gait Details: bed to chair only  2* fatigue, pain and intermittent nausea  Stairs            Wheelchair Mobility     Tilt Bed    Modified Rankin (Stroke Patients Only)       Balance Overall balance assessment: Needs assistance Sitting-balance support: No upper extremity supported, Feet supported Sitting balance-Leahy Scale: Fair     Standing balance support: Bilateral upper extremity supported Standing balance-Leahy Scale: Poor                               Pertinent Vitals/Pain Pain Assessment Pain Assessment: 0-10 Pain Score: 6  Pain Location: L hip Pain Descriptors / Indicators: Aching, Sore Pain Intervention(s): Limited activity within patient's tolerance, Monitored during session, Premedicated before session, Ice applied, Patient requesting pain meds-RN notified    Home Living Family/patient expects to be discharged to:: Private residence Living Arrangements: Spouse/significant other Available Help at Discharge: Family;Available 24 hours/day Type of Home: House Home Access: Stairs to enter Entrance Stairs-Rails: Right Entrance Stairs-Number of Steps: 3   Home Layout: Able to live on main level with bedroom/bathroom Home Equipment: Agricultural consultant (2 wheels);Cane - single point      Prior Function Prior Level of Function : Independent/Modified Independent             Mobility Comments: using cane as needed       Extremity/Trunk Assessment   Upper Extremity Assessment Upper Extremity Assessment: Overall WFL for  tasks assessed    Lower Extremity Assessment Lower Extremity Assessment: LLE deficits/detail    Cervical / Trunk Assessment Cervical / Trunk Assessment: Normal  Communication   Communication Communication: No apparent difficulties    Cognition Arousal: Alert Behavior During Therapy: WFL for tasks assessed/performed   PT - Cognitive impairments: No apparent impairments                         Following commands: Intact        Cueing Cueing Techniques: Verbal cues     General Comments      Exercises Total Joint Exercises Ankle Circles/Pumps: AROM, Both, 15 reps, Supine   Assessment/Plan    PT Assessment Patient needs continued PT services  PT Problem List Decreased strength;Decreased range of motion;Decreased activity tolerance;Decreased balance;Decreased mobility;Decreased knowledge of use of DME;Pain       PT Treatment Interventions DME instruction;Gait training;Stair training;Functional mobility training;Therapeutic exercise;Therapeutic activities;Patient/family education    PT Goals (Current goals can be found in the Care Plan section)  Acute Rehab PT Goals Patient Stated Goal: Regain IND PT Goal Formulation: With patient Potential to Achieve Goals: Good    Frequency 7X/week     Co-evaluation               AM-PAC PT 6 Clicks Mobility  Outcome Measure Help needed turning from your back to your side while in a flat bed without using bedrails?: A Lot Help needed moving from lying on your back to sitting on the side of a flat bed without using bedrails?: A Lot Help needed moving to and from a bed to a chair (including a wheelchair)?: A Lot Help needed standing up from a chair using your arms (e.g., wheelchair or bedside chair)?: A Lot Help needed to walk in hospital room?: Total Help needed climbing 3-5 steps with a railing? : Total 6 Click Score: 10    End of Session Equipment Utilized During Treatment: Gait belt Activity Tolerance: Patient limited by fatigue Patient left: in chair;with call bell/phone within reach;with family/visitor present Nurse Communication: Mobility status PT Visit Diagnosis: Unsteadiness on feet (R26.81);Difficulty in walking, not elsewhere classified (R26.2)    Time: 5366-4403 PT Time Calculation (min) (ACUTE ONLY): 24 min   Charges:   PT Evaluation $PT Eval Low Complexity: 1 Low PT Treatments $Therapeutic Activity: 8-22 mins PT General Charges $$  ACUTE PT VISIT: 1 Visit         Thedora Finlay PT Acute Rehabilitation Services Pager [phone] Office [phone]   Chosen Garron 07/27/2023, 5:00 PM

## 2023-07-27 NOTE — Op Note (Signed)
 Operative Note  Date of operation: 07/27/2023 Preoperative diagnosis: Left hip primary osteoarthritis Postoperative diagnosis: Same  Procedure: Left direct anterior total hip arthroplasty  Implants: DePuy Sectra GRIPTION acetabular component size 50, 32+0 polythene liner, size 4 Actis femoral component with standard offset, 32+5 ceramic head ball  Surgeon: Jeanella Milan. Lucienne Ryder, MD Assistant: Malena Scull PA-C  Anesthesia: Spinal EBL: 100 cc Antibiotics: IV Ancef  Complications: None  Indications: The patient is a 68 year old female with debilitating arthritis involving her left hip.  This has been well-documented with clinical exam and x-ray findings.  She has tried and failed conservative treatment for over a year with her left hip.  She does wish to proceed with a hip replacement.  She actually has a history of a direct anterior hip replacement performed in the past by one of my partners for hip arthritis with her right hip.  That is done well.  With that being said she is fully aware of the risks and benefits of the surgery.  Procedure description: After informed consent was obtained and the appropriate right hip was marked, the patient was brought to the operating room and set up on the stretcher where spinal anesthesia was obtained.  She was laid in supine position on stretcher and a Foley catheter was placed.  Traction boots were placed on both her feet and next she was placed supine on the Hana fracture table with a perineal post in place and both legs in inline skeletal traction devices but no traction applied.  Her left operative hip and pelvis were assessed radiographically.  The left hip was prepped and draped with DuraPrep and sterile drapes.  A timeout was called and she was identified as the correct patient the correct left hip.  An incision was then made just inferior and posterior to the ASIS and carried slightly obliquely down the leg.  The tensor fascia lata muscle identified  and the tensor fascia was divided longitudinally to proceed with a direct interposed the hip.  Circumflex vessels were identified and cauterized.  The hip capsule identified and opened up in L-type format.  Cobra retractors were placed around the medial and lateral femoral neck and a femoral neck cut was made with an oscillating saw just proximal to the lesser trochanter and this cut was completed with an osteotome.  A corkscrew guide was placed in the femoral head and the femoral head was removed in its entirety.  There was significant cartilage wear.  A bent Hohmann was placed over the medial acetabular rim and remnants of the acetabular labrum and other debris were removed.  Reaming was initiated from a size 43 reamer and stepwise segments going up to a size 50 reamer with all reamers placed under direct visualization and the last reamer placed under direct fluoroscopy in order to obtain the depth of reaming, the inclination and the anteversion.  The real DePuy sector GRIPTION acetabular component size 50 was then placed without difficulty followed by a 32+0 polythene liner.  Attention was then turned to the femur.  With the left leg externally rotated to 120 degrees, extended and adducted, a Mueller retractor was placed medially and Hohmann tractor behind the greater trochanter.  The lateral joint capsule was released and a box cutting osteotome was used to enter the femoral canal.  Broaching was initiated using the Actis broaching system from a size 0 going to a size 4.  With a size 4 in place without a standard offset femoral neck and a 32+1 trial hip  ball.  The right leg was brought over and up and with traction and internal rotation reduced in the pelvis.  Based on radiographic and clinical assessment we felt like we had to go with more leg length due to some instability.  We dislocated the hip and removed the trial components.  We then placed the real Actis femoral component with standard offset size 4 and  with the real 32+5 ceramic head ball and again this was reduced in the pelvis we are pleased with the stability assessed mechanically and radiographically.  I think I have increased her leg lengths some but we need to more stability and offset.  The soft tissue was irrigated with normal saline solution.  The joint capsule was closed with interrupted #1 Ethibond suture followed by #1 Vicryl close the tensor fascia.  0 Vicryl was used to close the deep tissue and 2-0 Vicryl was used to close subcutaneous tissue.  The skin was closed with staples.  An Aquacel dressing was applied.  The patient was taken off the Hana table and taken to recovery room.  Malena Scull, PA-C did assist during the entire case from beginning end and his assistance was crucial and medically necessary for soft tissue management and retraction, helping guide implant placement and a layered closure of the wound.

## 2023-07-27 NOTE — Plan of Care (Signed)
  Problem: Health Behavior/Discharge Planning: Goal: Ability to manage health-related needs will improve Outcome: Progressing   Problem: Clinical Measurements: Goal: Ability to maintain clinical measurements within normal limits will improve Outcome: Progressing Goal: Will remain free from infection Outcome: Progressing Goal: Diagnostic test results will improve Outcome: Progressing Goal: Respiratory complications will improve Outcome: Progressing Goal: Cardiovascular complication will be avoided Outcome: Progressing   Problem: Activity: Goal: Risk for activity intolerance will decrease Outcome: Progressing   Problem: Nutrition: Goal: Adequate nutrition will be maintained Outcome: Progressing   Problem: Coping: Goal: Level of anxiety will decrease Outcome: Progressing   Problem: Elimination: Goal: Will not experience complications related to bowel motility Outcome: Progressing Goal: Will not experience complications related to urinary retention Outcome: Progressing   Problem: Pain Managment: Goal: General experience of comfort will improve and/or be controlled Outcome: Progressing   Problem: Safety: Goal: Ability to remain free from injury will improve Outcome: Progressing   Problem: Skin Integrity: Goal: Risk for impaired skin integrity will decrease Outcome: Progressing   Problem: Education: Goal: Knowledge of the prescribed therapeutic regimen will improve Outcome: Progressing Goal: Understanding of discharge needs will improve Outcome: Progressing   Problem: Activity: Goal: Ability to avoid complications of mobility impairment will improve Outcome: Progressing Goal: Ability to tolerate increased activity will improve Outcome: Progressing   Problem: Clinical Measurements: Goal: Postoperative complications will be avoided or minimized Outcome: Progressing   Problem: Pain Management: Goal: Pain level will decrease with appropriate  interventions Outcome: Progressing   Problem: Skin Integrity: Goal: Will show signs of wound healing Outcome: Progressing

## 2023-07-27 NOTE — Anesthesia Procedure Notes (Addendum)
 Spinal  Start time: 07/27/2023 8:48 AM End time: 07/27/2023 8:53 AM Reason for block: surgical anesthesia Staffing Performed: resident/CRNA  Resident/CRNA: Virgil Griffiths, CRNA Performed by: Virgil Griffiths, CRNA Authorized by: Erin Havers, MD   Preanesthetic Checklist Completed: patient identified, IV checked, site marked, risks and benefits discussed, surgical consent, monitors and equipment checked, pre-op evaluation and timeout performed Spinal Block Patient position: sitting Prep: DuraPrep Patient monitoring: heart rate Approach: midline Location: L3-4 Injection technique: single-shot Needle Needle type: Pencan  Needle gauge: 24 G Needle length: 10 cm Assessment Sensory level: T6 Events: CSF return

## 2023-07-27 NOTE — Interval H&P Note (Signed)
 History and Physical Interval Note: The patient understands that he she is here today for a left total hip replacement to treat her significant left hip pain and arthritis.  There has been no acute or interval change in her medical status.  The risks and benefits of surgery have been discussed in detail and informed consent has been obtained.  The left operative hip has been marked.  07/27/2023 7:05 AM  Courtney Wallace  has presented today for surgery, with the diagnosis of left hip osteoarthritis.  The various methods of treatment have been discussed with the patient and family. After consideration of risks, benefits and other options for treatment, the patient has consented to  Procedure(s): ARTHROPLASTY, HIP, TOTAL, ANTERIOR APPROACH (Left) as a surgical intervention.  The patient's history has been reviewed, patient examined, no change in status, stable for surgery.  I have reviewed the patient's chart and labs.  Questions were answered to the patient's satisfaction.     Arnie Lao

## 2023-07-28 DIAGNOSIS — Z79899 Other long term (current) drug therapy: Secondary | ICD-10-CM | POA: Diagnosis not present

## 2023-07-28 DIAGNOSIS — Z96641 Presence of right artificial hip joint: Secondary | ICD-10-CM | POA: Diagnosis not present

## 2023-07-28 DIAGNOSIS — Z7982 Long term (current) use of aspirin: Secondary | ICD-10-CM | POA: Diagnosis not present

## 2023-07-28 DIAGNOSIS — Z85828 Personal history of other malignant neoplasm of skin: Secondary | ICD-10-CM | POA: Diagnosis not present

## 2023-07-28 DIAGNOSIS — M1612 Unilateral primary osteoarthritis, left hip: Secondary | ICD-10-CM | POA: Diagnosis not present

## 2023-07-28 LAB — BASIC METABOLIC PANEL WITH GFR
Anion gap: 8 (ref 5–15)
BUN: 13 mg/dL (ref 8–23)
CO2: 24 mmol/L (ref 22–32)
Calcium: 8.6 mg/dL — ABNORMAL LOW (ref 8.9–10.3)
Chloride: 102 mmol/L (ref 98–111)
Creatinine, Ser: 0.75 mg/dL (ref 0.44–1.00)
GFR, Estimated: >60 mL/min (ref >60)
Glucose, Bld: 121 mg/dL — ABNORMAL HIGH (ref 70–99)
Potassium: 3.7 mmol/L (ref 3.5–5.1)
Sodium: 134 mmol/L — ABNORMAL LOW (ref 135–145)

## 2023-07-28 LAB — CBC
HCT: 31.6 % — ABNORMAL LOW (ref 36.0–46.0)
Hemoglobin: 10.2 g/dL — ABNORMAL LOW (ref 12.0–15.0)
MCH: 30.5 pg (ref 26.0–34.0)
MCHC: 32.3 g/dL (ref 30.0–36.0)
MCV: 94.6 fL (ref 80.0–100.0)
Platelets: 305 10*3/uL (ref 150–400)
RBC: 3.34 MIL/uL — ABNORMAL LOW (ref 3.87–5.11)
RDW: 13.5 % (ref 11.5–15.5)
WBC: 9.1 10*3/uL (ref 4.0–10.5)
nRBC: 0 % (ref 0.0–0.2)

## 2023-07-28 MED ORDER — METHOCARBAMOL 500 MG PO TABS: 500.0000 mg | ORAL_TABLET | Freq: Four times a day (QID) | ORAL | 1 refills | Status: AC | PRN

## 2023-07-28 MED ORDER — ASPIRIN 81 MG PO CHEW: 81.0000 mg | CHEWABLE_TABLET | Freq: Two times a day (BID) | ORAL | 0 refills | Status: AC

## 2023-07-28 MED ORDER — OXYCODONE HCL 5 MG PO TABS: 5.0000 mg | ORAL_TABLET | Freq: Four times a day (QID) | ORAL | 0 refills | Status: AC | PRN

## 2023-07-28 NOTE — Progress Notes (Signed)
 Physical Therapy Treatment Patient Details Name: Courtney Wallace MRN: [id number] DOB: March 14, 1955 Today's Date: 07/28/2023   History of Present Illness Pt s/p L THR and with hx of R THR ~ 1 YR AGO    PT Comments  Pt continues very cooperative and progressing steadily with mobility but continues to require physical assist and increased time for safe performance of all basic mobility tasks.   If plan is discharge home, recommend the following: A little help with walking and/or transfers;A little help with bathing/dressing/bathroom;Assistance with cooking/housework;Assist for transportation;Help with stairs or ramp for entrance   Can travel by private vehicle        Equipment Recommendations  None recommended by PT    Recommendations for Other Services       Precautions / Restrictions Precautions Precautions: Fall Recall of Precautions/Restrictions: Intact Restrictions Weight Bearing Restrictions Per Provider Order: No LLE Weight Bearing Per Provider Order: Weight bearing as tolerated     Mobility  Bed Mobility Overal bed mobility: Needs Assistance Bed Mobility: Sit to Supine     Supine to sit: Min assist Sit to supine: Min assist, Mod assist   General bed mobility comments: Increased time with cues for sequence and use of R LE to self assist    Transfers Overall transfer level: Needs assistance Equipment used: Rolling walker (2 wheels) Transfers: Sit to/from Stand Sit to Stand: Min assist           General transfer comment: cues for LE management and use of UEs to self assist;    Ambulation/Gait Ambulation/Gait assistance: Contact guard assist Gait Distance (Feet): 56 Feet Assistive device: Rolling walker (2 wheels) Gait Pattern/deviations: Step-to pattern Gait velocity: decr     General Gait Details: Increased time with cues for sequence, posture and position from RW   Stairs             Wheelchair Mobility     Tilt Bed    Modified Rankin  (Stroke Patients Only)       Balance Overall balance assessment: Needs assistance Sitting-balance support: No upper extremity supported, Feet supported Sitting balance-Leahy Scale: Good     Standing balance support: Bilateral upper extremity supported Standing balance-Leahy Scale: Poor                              Communication Communication Communication: No apparent difficulties  Cognition Arousal: Alert Behavior During Therapy: WFL for tasks assessed/performed   PT - Cognitive impairments: No apparent impairments                         Following commands: Intact      Cueing Cueing Techniques: Verbal cues  Exercises Total Joint Exercises Ankle Circles/Pumps: AROM, Both, 15 reps, Supine Quad Sets: AROM, Both, 10 reps, Supine Heel Slides: AAROM, Left, 20 reps, Supine Hip ABduction/ADduction: AAROM, Left, 15 reps, Supine    General Comments        Pertinent Vitals/Pain Pain Assessment Pain Assessment: 0-10 Pain Score: 6  Pain Location: L hip Pain Descriptors / Indicators: Aching, Sore Pain Intervention(s): Premedicated before session, Monitored during session, Limited activity within patient's tolerance, Ice applied, Patient requesting pain meds-RN notified    Home Living                          Prior Function  PT Goals (current goals can now be found in the care plan section) Acute Rehab PT Goals Patient Stated Goal: Regain IND PT Goal Formulation: With patient Potential to Achieve Goals: Good Progress towards PT goals: Progressing toward goals    Frequency    7X/week      PT Plan      Co-evaluation              AM-PAC PT 6 Clicks Mobility   Outcome Measure  Help needed turning from your back to your side while in a flat bed without using bedrails?: A Little Help needed moving from lying on your back to sitting on the side of a flat bed without using bedrails?: A Little Help needed moving  to and from a bed to a chair (including a wheelchair)?: A Little Help needed standing up from a chair using your arms (e.g., wheelchair or bedside chair)?: A Little Help needed to walk in hospital room?: A Little Help needed climbing 3-5 steps with a railing? : A Lot 6 Click Score: 17    End of Session Equipment Utilized During Treatment: Gait belt Activity Tolerance: Patient tolerated treatment well;Patient limited by fatigue;Patient limited by pain Patient left: in bed;with call bell/phone within reach;with bed alarm set Nurse Communication: Mobility status PT Visit Diagnosis: Unsteadiness on feet (R26.81);Difficulty in walking, not elsewhere classified (R26.2)     Time: 8882-8852 PT Time Calculation (min) (ACUTE ONLY): 30 min  Charges:    $Gait Training: 8-22 mins $Therapeutic Exercise: 8-22 mins $Therapeutic Activity: 8-22 mins PT General Charges $$ ACUTE PT VISIT: 1 Visit                     Katrinka Acton PT Acute Rehabilitation Services Pager [phone] Office [phone]    Kareema Keitt 07/28/2023, 3:11 PM

## 2023-07-28 NOTE — Progress Notes (Signed)
 Physical Therapy Treatment Patient Details Name: Courtney Wallace MRN: [id number] DOB: Oct 27, 1955 Today's Date: 07/28/2023   History of Present Illness Pt s/p L THR and with hx of R THR ~ 1 YR AGO    PT Comments  Pt continues very cooperative and progressing steadily with mobility but continues to require increased time and physical assist for safe performance of all basic mobility tasks.   If plan is discharge home, recommend the following: A little help with walking and/or transfers;A little help with bathing/dressing/bathroom;Assistance with cooking/housework;Assist for transportation;Help with stairs or ramp for entrance   Can travel by private vehicle        Equipment Recommendations  None recommended by PT    Recommendations for Other Services       Precautions / Restrictions Precautions Precautions: Fall Recall of Precautions/Restrictions: Intact Restrictions Weight Bearing Restrictions Per Provider Order: No LLE Weight Bearing Per Provider Order: Weight bearing as tolerated     Mobility  Bed Mobility Overal bed mobility: Needs Assistance Bed Mobility: Supine to Sit     Supine to sit: Min assist     General bed mobility comments: Increased time with cues for sequence and use of R LE to self assist    Transfers Overall transfer level: Needs assistance Equipment used: Rolling walker (2 wheels) Transfers: Sit to/from Stand Sit to Stand: Min assist, From elevated surface           General transfer comment: cues for LE management and use of UEs to self assist;    Ambulation/Gait Ambulation/Gait assistance: Min assist, Contact guard assist Gait Distance (Feet): 68 Feet Assistive device: Rolling walker (2 wheels) Gait Pattern/deviations: Step-to pattern Gait velocity: decr     General Gait Details: Increased time with cues for sequence, posture and position from [employer]             Wheelchair Mobility     Tilt Bed    Modified Rankin  (Stroke Patients Only)       Balance Overall balance assessment: Needs assistance Sitting-balance support: No upper extremity supported, Feet supported Sitting balance-Leahy Scale: Good     Standing balance support: Bilateral upper extremity supported Standing balance-Leahy Scale: Poor                              Communication Communication Communication: No apparent difficulties  Cognition Arousal: Alert Behavior During Therapy: WFL for tasks assessed/performed   PT - Cognitive impairments: No apparent impairments                         Following commands: Intact      Cueing Cueing Techniques: Verbal cues  Exercises Total Joint Exercises Ankle Circles/Pumps: AROM, Both, 15 reps, Supine Quad Sets: AROM, Both, 10 reps, Supine Heel Slides: AAROM, Left, 20 reps, Supine Hip ABduction/ADduction: AAROM, Left, 15 reps, Supine    General Comments        Pertinent Vitals/Pain Pain Assessment Pain Assessment: 0-10 Pain Score: 5  Pain Location: L hip Pain Descriptors / Indicators: Aching, Sore Pain Intervention(s): Limited activity within patient's tolerance, Monitored during session, Premedicated before session, Ice applied    Home Living                          Prior Function            PT Goals (current goals can  now be found in the care plan section) Acute Rehab PT Goals Patient Stated Goal: Regain IND PT Goal Formulation: With patient Potential to Achieve Goals: Good Progress towards PT goals: Progressing toward goals    Frequency    7X/week      PT Plan      Co-evaluation              AM-PAC PT 6 Clicks Mobility   Outcome Measure  Help needed turning from your back to your side while in a flat bed without using bedrails?: A Little Help needed moving from lying on your back to sitting on the side of a flat bed without using bedrails?: A Little Help needed moving to and from a bed to a chair (including a  wheelchair)?: A Little Help needed standing up from a chair using your arms (e.g., wheelchair or bedside chair)?: A Little Help needed to walk in hospital room?: A Little Help needed climbing 3-5 steps with a railing? : A Lot 6 Click Score: 17    End of Session Equipment Utilized During Treatment: Gait belt Activity Tolerance: Patient tolerated treatment well Patient left: in chair;with call bell/phone within reach Nurse Communication: Mobility status PT Visit Diagnosis: Unsteadiness on feet (R26.81);Difficulty in walking, not elsewhere classified (R26.2)     Time: 9144-9059 PT Time Calculation (min) (ACUTE ONLY): 45 min  Charges:    $Gait Training: 8-22 mins $Therapeutic Exercise: 8-22 mins $Therapeutic Activity: 8-22 mins PT General Charges $$ ACUTE PT VISIT: 1 Visit                     Katrinka Acton PT Acute Rehabilitation Services Pager [phone] Office [phone]    Kmarion Rawl 07/28/2023, 3:06 PM

## 2023-07-28 NOTE — Progress Notes (Signed)
 Patient ID: BRINDLEY MADARANG, female   DOB: 1955-04-20, 67 y.o.   MRN: [id number] The patient is awake and alert.  Her vital signs are stable and her left operative hip is stable.  She has had slow mobility with physical therapy and I did speak with physical therapy and they feel that the patient would benefit from another day here and I agree with this as well.  I did not replace her other hip but apparently she was discharged quickly the next day after that surgery without any type of home therapy and even instructions on how to work with stairs and getting into the house easily.  I believe this was way too quick the last time and we need to go a little bit slower to make sure that she is not a fall risk and it is appropriate for a mobility standpoint and safety standpoint for her to be going home.  Her labs are stable.  Her left hip dressing is clean and dry.  The hope is to have her therapy sessions today and tomorrow with the potential discharge to home later tomorrow if she continues to progress well with her mobility.

## 2023-07-28 NOTE — TOC Initial Note (Signed)
 Transition of Care [facility]) - Initial/Assessment Note    Patient Details  Name: Courtney Wallace MRN: [id number] Date of Birth: [birth year]  [facility]) CM/SW Contact:    Heather DELENA Saltness, LCSW Phone Number: 07/28/2023, 11:34 AM  Clinical Narrative:                 CSW met with pt to discuss discharge planning. Pt recommended for [facility] PT/OT services upon discharge. Pt denies having HH agency preference. CSW spoke with Jon at [facility], who confirmed able to accept pt. No DME needs at this time. TOC will continue to follow.    Expected Discharge Plan: Home w Home Health Services Barriers to Discharge: Continued Medical Work up   Patient Goals and CMS Choice Patient states their goals for this hospitalization and ongoing recovery are:: To return home   Choice offered to / list presented to : Patient      Expected Discharge Plan and Services In-house Referral: Clinical Social Work   Post Acute Care Choice: Home Health Living arrangements for the past 2 months: Single Family Home                 DME Arranged: N/A DME Agency: NA       HH Arranged: PT, OT HH Agency: Other - See comment Producer, television/film/video) Date HH Agency Contacted: 07/28/23 Time HH Agency Contacted: 1133 Representative spoke with at [facility] Agency: Jon  Prior Living Arrangements/Services Living arrangements for the past 2 months: Single Family Home Lives with:: Self Patient language and need for interpreter reviewed:: Yes Do you feel safe going back to the place where you live?: Yes      Need for Family Participation in Patient Care: No (Comment) Care giver support system in place?: Yes (comment)   Criminal Activity/Legal Involvement Pertinent to Current Situation/Hospitalization: No - Comment as needed  Activities of Daily Living   ADL Screening (condition at time of admission) Independently performs ADLs?: Yes (appropriate for developmental age) Is the patient deaf or have difficulty hearing?: No Does  the patient have difficulty seeing, even when wearing glasses/contacts?: No Does the patient have difficulty concentrating, remembering, or making decisions?: No  Permission Sought/Granted Permission sought to share information with : Case Manager Permission granted to share information with : Yes, Verbal Permission Granted     Permission granted to share info w AGENCY: [facility]        Emotional Assessment Appearance:: Appears stated age Attitude/Demeanor/Rapport: Engaged Affect (typically observed): Pleasant, Appropriate Orientation: : Oriented to Self, Oriented to Place, Oriented to  Time, Oriented to Situation Alcohol / Substance Use: Not Applicable Psych Involvement: No (comment)  Admission diagnosis:  Primary osteoarthritis of left hip [M16.12] Status post total replacement of left hip [Z96.642] Patient Active Problem List   Diagnosis Date Noted   Status post total replacement of left hip 07/27/2023   Unilateral primary osteoarthritis, left hip 05/23/2023   S/P total right hip arthroplasty 06/28/2022   Screening for colon cancer 02/07/2022   Benign neoplasm of transverse colon 02/07/2022   Hemorrhoids without complication 02/07/2022   PCP:  Trudy Vaughn FALCON, MD Pharmacy:   [facility] [place] [place] - [address] [place] [address] Phone: [phone] Fax: [phone]     Social Drivers of Health (SDOH) Social History: SDOH Screenings   Food Insecurity: No Food Insecurity (07/27/2023)  Housing: Low Risk  (07/27/2023)  Transportation Needs: No Transportation Needs (07/27/2023)  Utilities: Not At Risk (07/27/2023)  Social Connections: Moderately Integrated (07/27/2023)  Tobacco Use: Low Risk  (07/27/2023)   SDOH Interventions: None indicated     Readmission Risk Interventions     No data to display          Heather Saltness, MSW, LCSW 07/28/2023 11:44 AM

## 2023-07-28 NOTE — Discharge Instructions (Signed)

## 2023-07-28 NOTE — Care Management Obs Status (Signed)
 MEDICARE OBSERVATION STATUS NOTIFICATION   Patient Details  Name: Courtney Wallace MRN: [id number] Date of Birth: [birth year]   Medicare Observation Status Notification Given:  Yes    Heather DELENA Saltness, LCSW 07/28/2023, 9:53 AM

## 2023-07-29 DIAGNOSIS — Z7982 Long term (current) use of aspirin: Secondary | ICD-10-CM | POA: Diagnosis not present

## 2023-07-29 DIAGNOSIS — Z79899 Other long term (current) drug therapy: Secondary | ICD-10-CM | POA: Diagnosis not present

## 2023-07-29 DIAGNOSIS — Z85828 Personal history of other malignant neoplasm of skin: Secondary | ICD-10-CM | POA: Diagnosis not present

## 2023-07-29 DIAGNOSIS — M1612 Unilateral primary osteoarthritis, left hip: Secondary | ICD-10-CM | POA: Diagnosis not present

## 2023-07-29 DIAGNOSIS — Z96641 Presence of right artificial hip joint: Secondary | ICD-10-CM | POA: Diagnosis not present

## 2023-07-29 NOTE — Progress Notes (Signed)
 Physical Therapy Treatment Patient Details Name: Courtney Wallace MRN: [id number] DOB: 1955/11/24 Today's Date: 07/29/2023   History of Present Illness Pt s/p L THR and with hx of R THR ~ 1 YR AGO    PT Comments  Pt continues very cooperative and progressing well with mobility.  Pt requiring increased time and cues but unassisted to exit bed and up to ambulate increased distance in hall.  Will follow up with stair training.  Pt eager for dc home this date.    If plan is discharge home, recommend the following: A little help with walking and/or transfers;A little help with bathing/dressing/bathroom;Assistance with cooking/housework;Assist for transportation;Help with stairs or ramp for entrance   Can travel by private vehicle        Equipment Recommendations  None recommended by PT    Recommendations for Other Services       Precautions / Restrictions Precautions Precautions: Fall Recall of Precautions/Restrictions: Intact Restrictions Weight Bearing Restrictions Per Provider Order: No LLE Weight Bearing Per Provider Order: Weight bearing as tolerated     Mobility  Bed Mobility Overal bed mobility: Needs Assistance Bed Mobility: Supine to Sit     Supine to sit: Supervision     General bed mobility comments: Increased time with cues for sequence and use of gait belt to self assist    Transfers Overall transfer level: Needs assistance Equipment used: Rolling walker (2 wheels) Transfers: Sit to/from Stand Sit to Stand: Contact guard assist, Supervision           General transfer comment: min cues for LE management and use of UEs to self assist;    Ambulation/Gait Ambulation/Gait assistance: Supervision Gait Distance (Feet): 100 Feet Assistive device: Rolling walker (2 wheels) Gait Pattern/deviations: Step-to pattern Gait velocity: decr     General Gait Details: Increased time with min cues for posture and position from RW   Stairs              Wheelchair Mobility     Tilt Bed    Modified Rankin (Stroke Patients Only)       Balance Overall balance assessment: Needs assistance Sitting-balance support: No upper extremity supported, Feet supported Sitting balance-Leahy Scale: Good     Standing balance support: No upper extremity supported Standing balance-Leahy Scale: Fair                              Hotel manager: No apparent difficulties  Cognition Arousal: Alert Behavior During Therapy: WFL for tasks assessed/performed   PT - Cognitive impairments: No apparent impairments                         Following commands: Intact      Cueing Cueing Techniques: Verbal cues  Exercises Total Joint Exercises Ankle Circles/Pumps: AROM, Both, 15 reps, Supine Quad Sets: AROM, Both, 10 reps, Supine Heel Slides: AAROM, Left, 20 reps, Supine Hip ABduction/ADduction: AAROM, Left, 15 reps, Supine    General Comments        Pertinent Vitals/Pain Pain Assessment Pain Assessment: 0-10 Pain Score: 6  Pain Location: L hip Pain Descriptors / Indicators: Aching, Sore Pain Intervention(s): Limited activity within patient's tolerance, Monitored during session, Premedicated before session, Ice applied    Home Living                          Prior Function  PT Goals (current goals can now be found in the care plan section) Acute Rehab PT Goals Patient Stated Goal: Regain IND PT Goal Formulation: With patient Potential to Achieve Goals: Good Progress towards PT goals: Progressing toward goals    Frequency    7X/week      PT Plan      Co-evaluation              AM-PAC PT 6 Clicks Mobility   Outcome Measure  Help needed turning from your back to your side while in a flat bed without using bedrails?: A Little Help needed moving from lying on your back to sitting on the side of a flat bed without using bedrails?: A Little Help  needed moving to and from a bed to a chair (including a wheelchair)?: A Little Help needed standing up from a chair using your arms (e.g., wheelchair or bedside chair)?: A Little Help needed to walk in hospital room?: A Little Help needed climbing 3-5 steps with a railing? : A Lot 6 Click Score: 17    End of Session Equipment Utilized During Treatment: Gait belt Activity Tolerance: Patient tolerated treatment well Patient left: in chair;with call bell/phone within reach;with nursing/sitter in room Nurse Communication: Mobility status PT Visit Diagnosis: Unsteadiness on feet (R26.81);Difficulty in walking, not elsewhere classified (R26.2)     Time: 0812-0852 PT Time Calculation (min) (ACUTE ONLY): 40 min  Charges:    $Gait Training: 8-22 mins $Therapeutic Exercise: 8-22 mins $Therapeutic Activity: 8-22 mins PT General Charges $$ ACUTE PT VISIT: 1 Visit                     Katrinka Acton PT Acute Rehabilitation Services Pager [phone] Office [phone]    Courtney Wallace 07/29/2023, 8:56 AM

## 2023-07-29 NOTE — Plan of Care (Signed)
  Problem: Health Behavior/Discharge Planning: Goal: Ability to manage health-related needs will improve Outcome: Adequate for Discharge   Problem: Clinical Measurements: Goal: Ability to maintain clinical measurements within normal limits will improve Outcome: Adequate for Discharge Goal: Will remain free from infection Outcome: Adequate for Discharge Goal: Diagnostic test results will improve Outcome: Adequate for Discharge Goal: Respiratory complications will improve Outcome: Adequate for Discharge Goal: Cardiovascular complication will be avoided Outcome: Adequate for Discharge   Problem: Activity: Goal: Risk for activity intolerance will decrease Outcome: Adequate for Discharge   Problem: Nutrition: Goal: Adequate nutrition will be maintained Outcome: Adequate for Discharge   Problem: Coping: Goal: Level of anxiety will decrease Outcome: Adequate for Discharge   Problem: Elimination: Goal: Will not experience complications related to bowel motility Outcome: Adequate for Discharge Goal: Will not experience complications related to urinary retention Outcome: Adequate for Discharge   Problem: Pain Managment: Goal: General experience of comfort will improve and/or be controlled Outcome: Adequate for Discharge   Problem: Safety: Goal: Ability to remain free from injury will improve Outcome: Adequate for Discharge   Problem: Skin Integrity: Goal: Risk for impaired skin integrity will decrease Outcome: Adequate for Discharge   Problem: Education: Goal: Knowledge of the prescribed therapeutic regimen will improve Outcome: Adequate for Discharge Goal: Understanding of discharge needs will improve Outcome: Adequate for Discharge   Problem: Activity: Goal: Ability to avoid complications of mobility impairment will improve Outcome: Adequate for Discharge Goal: Ability to tolerate increased activity will improve Outcome: Adequate for Discharge   Problem: Clinical  Measurements: Goal: Postoperative complications will be avoided or minimized Outcome: Adequate for Discharge   Problem: Pain Management: Goal: Pain level will decrease with appropriate interventions Outcome: Adequate for Discharge   Problem: Skin Integrity: Goal: Will show signs of wound healing Outcome: Adequate for Discharge

## 2023-07-29 NOTE — Progress Notes (Signed)
Reviewed written d/c instructions w pt and all questions answered. She verbalized understanding. D/C via w/c w all belongings in stable condition. 

## 2023-07-29 NOTE — Discharge Summary (Signed)
 Patient ID: Courtney Wallace MRN: [id number] DOB/AGE: 1955-07-12 68 y.o.  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 female who was admitted 07/27/2023 for operative treatment ofUnilateral primary osteoarthritis, left hip. Patient has severe unremitting pain that affects sleep, daily activities, and work/hobbies. After pre-op clearance the patient was taken to the operating room on 07/27/2023 and underwent  Procedure(s): ARTHROPLASTY, HIP, TOTAL, ANTERIOR APPROACH.    Patient was given perioperative antibiotics:  Anti-infectives (From admission, onward)    Start     Dose/Rate Route Frequency Ordered Stop   07/27/23 1500  ceFAZolin  (ANCEF ) IVPB 2g/100 mL premix        2 g 200 mL/hr over 30 Minutes Intravenous Every 6 hours 07/27/23 1218 07/27/23 2120   07/27/23 0645  ceFAZolin  (ANCEF ) IVPB 2g/100 mL premix        2 g 200 mL/hr over 30 Minutes Intravenous On call to O.R. 07/27/23 9366 07/27/23 9145        Patient was given sequential compression devices, early ambulation, and chemoprophylaxis to prevent DVT.  Patient benefited maximally from hospital stay and there were no complications.    Recent vital signs: Patient Vitals for the past 24 hrs:  BP Temp Pulse Resp SpO2  07/29/23 0504 122/65 98.2 F (36.8 C) 94 16 93 %   07/28/23 2209 123/62 98.9 F (37.2 C) 92 16 92 %     Recent laboratory studies:  Recent Labs    07/28/23 0354  WBC 9.1  HGB 10.2*  HCT 31.6*  PLT 305  NA 134*  K 3.7  CL 102  CO2 24  BUN 13  CREATININE 0.75  GLUCOSE 121*  CALCIUM  8.6*     Discharge Medications:   Allergies as of 07/29/2023       Reactions   Penicillins Rash   Has patient had a PCN reaction causing immediate rash, facial/tongue/throat swelling, SOB or lightheadedness with hypotension: Yes Has patient had a PCN reaction causing severe rash involving mucus membranes or skin necrosis: No Has patient had a PCN reaction that required hospitalization No Has patient had a PCN reaction occurring within the last 10 years: No If all of the above answers are NO, then may proceed with Cephalosporin use.        Medication List     TAKE these medications    acetaminophen  650 MG CR tablet Commonly known as: TYLENOL  Take 1,300 mg by mouth every 8 (eight) hours as needed for pain.   aspirin  81 MG chewable tablet Chew 1 tablet (81 mg total) by mouth 2 (two) times daily.   CALCIUM  PO Take 600 mg by mouth daily.   cholecalciferol  25 MCG (1000 UNIT) tablet Commonly known as: VITAMIN D3 Take 1,000 Units by mouth  daily.   methocarbamol  500 MG tablet Commonly known as: ROBAXIN  Take 1 tablet (500 mg total) by mouth every 6 (six) hours as needed for muscle spasms.   OVER THE COUNTER MEDICATION   oxyCODONE  5 MG immediate release tablet Commonly known as: Oxy IR/ROXICODONE  Take 1-2 tablets (5-10 mg total) by mouth every 6 (six) hours as needed for moderate pain (pain score 4-6) (pain score 4-6).   Sutab  334 125 0788 MG Tabs Generic drug: Sodium Sulfate-Mag Sulfate-KCl Take as directed based on the instructions you received in the office   Vitamin B-12 2500 MCG Subl Place 2,500 mcg under the tongue every other day.               Durable Medical Equipment  (From admission, onward)            Start     Ordered   07/27/23 1219  DME 3 n 1  Once        07/27/23 1218   07/27/23 1219  DME Walker rolling  Once       Question Answer Comment  Walker: With 5 Inch Wheels   Patient needs a walker to treat with the following condition Status post total replacement of left hip      07/27/23 1218            Diagnostic Studies: DG Pelvis Portable Result Date: 07/27/2023 CLINICAL DATA:  Status post total left hip arthroplasty. EXAM: PORTABLE PELVIS 1-2 VIEWS COMPARISON:  Pelvis and left hip radiographs 05/23/2023 FINDINGS: Interval total left hip arthroplasty. Redemonstration of total right hip arthroplasty. No perihardware lucency is seen to indicate hardware failure or loosening. Expected postoperative left hip subcutaneous and intra-articular air. Lateral left hip surgical skin staples. Mild bilateral sacroiliac subchondral sclerosis. No acute fracture or dislocation. IMPRESSION: Interval total left hip arthroplasty without evidence of hardware failure. Electronically Signed   By: Tanda Lyons M.D.   On: 07/27/2023 12:14   DG HIP UNILAT WITH PELVIS 1V LEFT Result Date: 07/27/2023 CLINICAL DATA:  Elective surgery.  Total left hip arthroplasty. EXAM: DG HIP (WITH OR WITHOUT PELVIS) 1V*L* COMPARISON:  Pelvis and left hip radiographs 05/23/2023 FINDINGS: Images were performed intraoperatively without the presence of a radiologist. The patient is undergoing total left hip arthroplasty. Partial visualization of prior total right hip arthroplasty. No hardware complication is seen. Total fluoroscopy images: 3 Total fluoroscopy time: 18 seconds Total dose: Radiation Exposure Index (as provided by the fluoroscopic device): 2.2 mGy air Kerma Please see intraoperative findings for further detail. IMPRESSION: Intraoperative fluoroscopy for total left hip arthroplasty. Electronically Signed   By: Tanda Lyons M.D.   On: 07/27/2023 12:13   DG C-Arm 1-60 Min-No Report Result Date: 07/27/2023 Fluoroscopy was  utilized by the requesting physician.  No radiographic interpretation.   DG C-Arm 1-60 Min-No Report Result Date: 07/27/2023 Fluoroscopy was utilized by the requesting physician.  No radiographic interpretation.    Disposition: Discharge disposition: 01-Home or Self Care          Follow-up Information     Vernetta Lonni GRADE, MD Follow up in 2 week(s).   Specialty: Orthopedic Surgery Contact information: [address] [place] [address] [phone]         [facility] [place], [place] Follow up.   Why: Please follow up with this provider for home health PT/OT services upon discharge. Contact information: [address] [place] [address] [phone]  Signed: Lonni CINDERELLA Poli 07/29/2023, 4:49 PM

## 2023-07-29 NOTE — Progress Notes (Signed)
 Physical Therapy Treatment Patient Details Name: Courtney Wallace MRN: [id number] DOB: [birth year] Today's Date: 07/29/2023   History of Present Illness Pt s/p L THR and with hx of R THR ~ 1 YR AGO    PT Comments  Pt continues motivated and progressing well with mobility including improvement in bed mobility, up to ambulate in hall, negotiated stairs, reviewed car transfers, and reviewed dressing techniques.  Pt eager for dc home this date.    If plan is discharge home, recommend the following: A little help with walking and/or transfers;A little help with bathing/dressing/bathroom;Assistance with cooking/housework;Assist for transportation;Help with stairs or ramp for entrance   Can travel by private vehicle        Equipment Recommendations  None recommended by PT    Recommendations for Other Services       Precautions / Restrictions Precautions Precautions: Fall Recall of Precautions/Restrictions: Intact Restrictions Weight Bearing Restrictions Per Provider Order: No LLE Weight Bearing Per Provider Order: Weight bearing as tolerated     Mobility  Bed Mobility Overal bed mobility: Needs Assistance Bed Mobility: Sit to Supine, Supine to Sit     Supine to sit: Supervision Sit to supine: Min assist   General bed mobility comments: Increased time with cues for sequence and use of gait belt to self assist    Transfers Overall transfer level: Needs assistance Equipment used: Rolling walker (2 wheels) Transfers: Sit to/from Stand Sit to Stand: Supervision           General transfer comment: min cues for LE management and use of UEs to self assist;    Ambulation/Gait Ambulation/Gait assistance: Supervision Gait Distance (Feet): 70 Feet Assistive device: Rolling walker (2 wheels) Gait Pattern/deviations: Step-to pattern Gait velocity: decr     General Gait Details: Increased time with min cues for posture and position from RW   Stairs Stairs: Yes Stairs  assistance: Min assist Stair Management: One rail Right, Step to pattern Number of Stairs: 6 General stair comments: 3 steps twice with rail and cane; cues for sequence   Wheelchair Mobility     Tilt Bed    Modified Rankin (Stroke Patients Only)       Balance Overall balance assessment: Needs assistance Sitting-balance support: No upper extremity supported, Feet supported Sitting balance-Leahy Scale: Good     Standing balance support: No upper extremity supported Standing balance-Leahy Scale: Fair                              Hotel manager: No apparent difficulties  Cognition Arousal: Alert Behavior During Therapy: WFL for tasks assessed/performed   PT - Cognitive impairments: No apparent impairments                         Following commands: Intact      Cueing Cueing Techniques: Verbal cues  Exercises Total Joint Exercises Ankle Circles/Pumps: AROM, Both, 15 reps, Supine Quad Sets: AROM, Both, 10 reps, Supine Heel Slides: AAROM, Left, 20 reps, Supine Hip ABduction/ADduction: AAROM, Left, 15 reps, Supine    General Comments        Pertinent Vitals/Pain Pain Assessment Pain Assessment: 0-10 Pain Score: 5  Pain Location: L hip Pain Descriptors / Indicators: Aching, Sore Pain Intervention(s): Monitored during session, Limited activity within patient's tolerance, Premedicated before session, Ice applied    Home Living  Prior Function            PT Goals (current goals can now be found in the care plan section) Acute Rehab PT Goals Patient Stated Goal: Regain IND PT Goal Formulation: With patient Potential to Achieve Goals: Good Progress towards PT goals: Progressing toward goals    Frequency    7X/week      PT Plan      Co-evaluation              AM-PAC PT 6 Clicks Mobility   Outcome Measure  Help needed turning from your back to your side  while in a flat bed without using bedrails?: A Little Help needed moving from lying on your back to sitting on the side of a flat bed without using bedrails?: A Little Help needed moving to and from a bed to a chair (including a wheelchair)?: A Little Help needed standing up from a chair using your arms (e.g., wheelchair or bedside chair)?: A Little Help needed to walk in hospital room?: A Little Help needed climbing 3-5 steps with a railing? : A Little 6 Click Score: 18    End of Session Equipment Utilized During Treatment: Gait belt Activity Tolerance: Patient tolerated treatment well Patient left: in bed;with call bell/phone within reach Nurse Communication: Mobility status PT Visit Diagnosis: Unsteadiness on feet (R26.81);Difficulty in walking, not elsewhere classified (R26.2)     Time: 8854-8769 PT Time Calculation (min) (ACUTE ONLY): 45 min  Charges:    $Gait Training: 8-22 mins $Therapeutic Activity: 8-22 mins PT General Charges $$ ACUTE PT VISIT: 1 Visit                     Katrinka Acton PT Acute Rehabilitation Services Pager [phone] Office [phone]    [facility] 07/29/2023, 12:47 PM

## 2023-07-29 NOTE — Progress Notes (Signed)
 Patient ID: Courtney Wallace, female   DOB: 1955-02-22, 68 y.o.   MRN: [id number] The patient is awake and alert.  Her vital signs are stable and her left operative hip is stable.  Her labs are stable.  Her left hip dressing is clean and dry.  The hope is to have her therapy sessions today and discharge to home later today if she continues to progress well with her mobility.  Destry Bezdek, MD [place]

## 2023-07-30 ENCOUNTER — Encounter (HOSPITAL_COMMUNITY): Payer: Self-pay | Admitting: Orthopaedic Surgery

## 2023-07-30 DIAGNOSIS — Z471 Aftercare following joint replacement surgery: Secondary | ICD-10-CM | POA: Diagnosis not present

## 2023-07-30 DIAGNOSIS — E785 Hyperlipidemia, unspecified: Secondary | ICD-10-CM | POA: Diagnosis not present

## 2023-07-30 DIAGNOSIS — Z7982 Long term (current) use of aspirin: Secondary | ICD-10-CM | POA: Diagnosis not present

## 2023-07-30 DIAGNOSIS — Z96642 Presence of left artificial hip joint: Secondary | ICD-10-CM | POA: Diagnosis not present

## 2023-07-30 DIAGNOSIS — Z96641 Presence of right artificial hip joint: Secondary | ICD-10-CM | POA: Diagnosis not present

## 2023-07-30 DIAGNOSIS — K760 Fatty (change of) liver, not elsewhere classified: Secondary | ICD-10-CM | POA: Diagnosis not present

## 2023-07-30 DIAGNOSIS — K219 Gastro-esophageal reflux disease without esophagitis: Secondary | ICD-10-CM | POA: Diagnosis not present

## 2023-07-30 DIAGNOSIS — R7303 Prediabetes: Secondary | ICD-10-CM | POA: Diagnosis not present

## 2023-07-30 DIAGNOSIS — Z85118 Personal history of other malignant neoplasm of bronchus and lung: Secondary | ICD-10-CM | POA: Diagnosis not present

## 2023-08-02 ENCOUNTER — Telehealth: Payer: Self-pay

## 2023-08-02 DIAGNOSIS — K760 Fatty (change of) liver, not elsewhere classified: Secondary | ICD-10-CM | POA: Diagnosis not present

## 2023-08-02 DIAGNOSIS — E785 Hyperlipidemia, unspecified: Secondary | ICD-10-CM | POA: Diagnosis not present

## 2023-08-02 DIAGNOSIS — Z96642 Presence of left artificial hip joint: Secondary | ICD-10-CM | POA: Diagnosis not present

## 2023-08-02 DIAGNOSIS — R7303 Prediabetes: Secondary | ICD-10-CM | POA: Diagnosis not present

## 2023-08-02 DIAGNOSIS — Z471 Aftercare following joint replacement surgery: Secondary | ICD-10-CM | POA: Diagnosis not present

## 2023-08-02 DIAGNOSIS — Z85118 Personal history of other malignant neoplasm of bronchus and lung: Secondary | ICD-10-CM | POA: Diagnosis not present

## 2023-08-02 DIAGNOSIS — K219 Gastro-esophageal reflux disease without esophagitis: Secondary | ICD-10-CM | POA: Diagnosis not present

## 2023-08-02 DIAGNOSIS — Z96641 Presence of right artificial hip joint: Secondary | ICD-10-CM | POA: Diagnosis not present

## 2023-08-02 DIAGNOSIS — Z7982 Long term (current) use of aspirin: Secondary | ICD-10-CM | POA: Diagnosis not present

## 2023-08-02 NOTE — Telephone Encounter (Signed)
 Annette with [facility] health called stating that patients bandage is open at the top edges and patient is having some drainage.  Would like a call back to discuss.  Patient had left hip surgery on 07/27/2023.  Cb# [phone].  Please advise.  Thank you.

## 2023-08-06 DIAGNOSIS — K219 Gastro-esophageal reflux disease without esophagitis: Secondary | ICD-10-CM | POA: Diagnosis not present

## 2023-08-06 DIAGNOSIS — Z471 Aftercare following joint replacement surgery: Secondary | ICD-10-CM | POA: Diagnosis not present

## 2023-08-06 DIAGNOSIS — Z85118 Personal history of other malignant neoplasm of bronchus and lung: Secondary | ICD-10-CM | POA: Diagnosis not present

## 2023-08-06 DIAGNOSIS — R7303 Prediabetes: Secondary | ICD-10-CM | POA: Diagnosis not present

## 2023-08-06 DIAGNOSIS — K760 Fatty (change of) liver, not elsewhere classified: Secondary | ICD-10-CM | POA: Diagnosis not present

## 2023-08-06 DIAGNOSIS — Z7982 Long term (current) use of aspirin: Secondary | ICD-10-CM | POA: Diagnosis not present

## 2023-08-06 DIAGNOSIS — Z96641 Presence of right artificial hip joint: Secondary | ICD-10-CM | POA: Diagnosis not present

## 2023-08-06 DIAGNOSIS — Z96642 Presence of left artificial hip joint: Secondary | ICD-10-CM | POA: Diagnosis not present

## 2023-08-06 DIAGNOSIS — E785 Hyperlipidemia, unspecified: Secondary | ICD-10-CM | POA: Diagnosis not present

## 2023-08-08 DIAGNOSIS — Z7982 Long term (current) use of aspirin: Secondary | ICD-10-CM | POA: Diagnosis not present

## 2023-08-08 DIAGNOSIS — Z96641 Presence of right artificial hip joint: Secondary | ICD-10-CM | POA: Diagnosis not present

## 2023-08-08 DIAGNOSIS — E785 Hyperlipidemia, unspecified: Secondary | ICD-10-CM | POA: Diagnosis not present

## 2023-08-08 DIAGNOSIS — R7303 Prediabetes: Secondary | ICD-10-CM | POA: Diagnosis not present

## 2023-08-08 DIAGNOSIS — K760 Fatty (change of) liver, not elsewhere classified: Secondary | ICD-10-CM | POA: Diagnosis not present

## 2023-08-08 DIAGNOSIS — Z85118 Personal history of other malignant neoplasm of bronchus and lung: Secondary | ICD-10-CM | POA: Diagnosis not present

## 2023-08-08 DIAGNOSIS — K219 Gastro-esophageal reflux disease without esophagitis: Secondary | ICD-10-CM | POA: Diagnosis not present

## 2023-08-08 DIAGNOSIS — Z471 Aftercare following joint replacement surgery: Secondary | ICD-10-CM | POA: Diagnosis not present

## 2023-08-08 DIAGNOSIS — Z96642 Presence of left artificial hip joint: Secondary | ICD-10-CM | POA: Diagnosis not present

## 2023-08-09 ENCOUNTER — Ambulatory Visit: Admitting: Orthopaedic Surgery

## 2023-08-09 ENCOUNTER — Encounter: Payer: Self-pay | Admitting: Orthopaedic Surgery

## 2023-08-09 DIAGNOSIS — Z96642 Presence of left artificial hip joint: Secondary | ICD-10-CM

## 2023-08-09 NOTE — Progress Notes (Signed)
 The patient is a 68 year old who is here for her first postoperative visit status post a left total hip arthroplasty that we did 2 weeks ago.  She has a previous right total hip arthroplasty that was done by Dr. Barbarann over a year ago.  She says this hip has been significantly different in terms of having home health therapy.  She did not have any home therapy after her other hip and she says now she is 2 months ahead of what she was at this time when she had her other hip done because of having no therapy at all.  She is ambulating with a rolling walker but she looks great.  She is not taking any narcotics at all.  On examination her left hip moves smoothly and fluidly.  The incision looks good.  Staples are removed and Steri-Strips applied.  Her calf is soft.  She has been compliant with a baby aspirin  twice daily.  At this point she will continue to slowly increase her activities as comfort allows.  Will see her back in a month to see how she is doing from a mobility standpoint but no x-rays are needed.

## 2023-08-14 DIAGNOSIS — E785 Hyperlipidemia, unspecified: Secondary | ICD-10-CM | POA: Diagnosis not present

## 2023-08-14 DIAGNOSIS — Z96642 Presence of left artificial hip joint: Secondary | ICD-10-CM | POA: Diagnosis not present

## 2023-08-14 DIAGNOSIS — Z471 Aftercare following joint replacement surgery: Secondary | ICD-10-CM | POA: Diagnosis not present

## 2023-08-14 DIAGNOSIS — K219 Gastro-esophageal reflux disease without esophagitis: Secondary | ICD-10-CM | POA: Diagnosis not present

## 2023-08-14 DIAGNOSIS — K760 Fatty (change of) liver, not elsewhere classified: Secondary | ICD-10-CM | POA: Diagnosis not present

## 2023-08-14 DIAGNOSIS — Z96641 Presence of right artificial hip joint: Secondary | ICD-10-CM | POA: Diagnosis not present

## 2023-08-14 DIAGNOSIS — Z7982 Long term (current) use of aspirin: Secondary | ICD-10-CM | POA: Diagnosis not present

## 2023-08-14 DIAGNOSIS — R7303 Prediabetes: Secondary | ICD-10-CM | POA: Diagnosis not present

## 2023-08-14 DIAGNOSIS — Z85118 Personal history of other malignant neoplasm of bronchus and lung: Secondary | ICD-10-CM | POA: Diagnosis not present

## 2023-08-22 DIAGNOSIS — R7303 Prediabetes: Secondary | ICD-10-CM | POA: Diagnosis not present

## 2023-08-22 DIAGNOSIS — Z96642 Presence of left artificial hip joint: Secondary | ICD-10-CM | POA: Diagnosis not present

## 2023-08-22 DIAGNOSIS — Z471 Aftercare following joint replacement surgery: Secondary | ICD-10-CM | POA: Diagnosis not present

## 2023-08-22 DIAGNOSIS — Z7982 Long term (current) use of aspirin: Secondary | ICD-10-CM | POA: Diagnosis not present

## 2023-08-22 DIAGNOSIS — K219 Gastro-esophageal reflux disease without esophagitis: Secondary | ICD-10-CM | POA: Diagnosis not present

## 2023-08-22 DIAGNOSIS — K760 Fatty (change of) liver, not elsewhere classified: Secondary | ICD-10-CM | POA: Diagnosis not present

## 2023-08-22 DIAGNOSIS — E785 Hyperlipidemia, unspecified: Secondary | ICD-10-CM | POA: Diagnosis not present

## 2023-08-22 DIAGNOSIS — Z85118 Personal history of other malignant neoplasm of bronchus and lung: Secondary | ICD-10-CM | POA: Diagnosis not present

## 2023-08-22 DIAGNOSIS — Z96641 Presence of right artificial hip joint: Secondary | ICD-10-CM | POA: Diagnosis not present

## 2023-09-10 ENCOUNTER — Ambulatory Visit (INDEPENDENT_AMBULATORY_CARE_PROVIDER_SITE_OTHER): Admitting: Orthopaedic Surgery

## 2023-09-10 ENCOUNTER — Encounter: Payer: Self-pay | Admitting: Orthopaedic Surgery

## 2023-09-10 DIAGNOSIS — Z96642 Presence of left artificial hip joint: Secondary | ICD-10-CM

## 2023-09-10 NOTE — Progress Notes (Signed)
 The patient is a 69 year old female who is now 6 weeks status post a left total hip arthroplasty.  She is ambulate with a rolling walker.  She has a history of a right hip replacement as well that was done by Dr. Barbarann.  She says she is getting there but she still little sensitive around the hip itself.  On examination both hips move smoothly and fluidly and have equal rotation with no blocks rotation and no complicating features.  Her incision looks good.  She is really sensitive around the IT band which is to be expected.  This should calm down with time.  She will continue to increase her activities as comfort allows. When you see her is not for 3 months unless she is having issues.  Will have a standing low AP pelvis at that visit.

## 2023-12-10 ENCOUNTER — Encounter: Payer: Self-pay | Admitting: Radiology

## 2023-12-12 ENCOUNTER — Encounter: Payer: Self-pay | Admitting: Orthopaedic Surgery

## 2023-12-12 ENCOUNTER — Other Ambulatory Visit (INDEPENDENT_AMBULATORY_CARE_PROVIDER_SITE_OTHER)

## 2023-12-12 ENCOUNTER — Ambulatory Visit: Admitting: Physician Assistant

## 2023-12-12 DIAGNOSIS — Z96643 Presence of artificial hip joint, bilateral: Secondary | ICD-10-CM

## 2023-12-12 NOTE — Progress Notes (Signed)
 HPI: Mrs. Stukes returns today status post left total hip arthroplasty.  She is 4-1/2 months status post left total hip.  She states overall she has good range of motion and strength.  She is doing well.  Only complaint is that her hip is somewhat stiff at times whenever she first begins to ambulate.  In both hips since undergoing hip surgery are cold at times.  States otherwise she is much improved since undergoing bilateral hip replacements.  She notes that both incisions have healed well.  Review of systems: See HPI otherwise negative  Physical exam: General well-developed well-nourished female no acute distress ambulates without any assistive device nonantalgic gait. Bilateral hips: Good range of motion of both hips without pain.  She will cross her legs bilaterally.  Radiograph: AP pelvis: Bilateral hips well located.  No hardware failure.  Components appear well-seated.  No acute fractures..   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  Otherwise follow-up as needed.  Questions were encouraged and answered at length.

## 2024-01-17 ENCOUNTER — Telehealth: Payer: Self-pay | Admitting: Orthopaedic Surgery

## 2024-01-17 NOTE — Telephone Encounter (Signed)
 Faxed to provided number

## 2024-01-17 NOTE — Telephone Encounter (Signed)
 Mandy from [facility] dentist called and said she need clearance for dental work. [phone] If you could fax it over Fax#[phone]

## 2024-07-21 ENCOUNTER — Ambulatory Visit: Admitting: Orthopaedic Surgery
